# Patient Record
Sex: Male | Born: 1965 | Race: White | Hispanic: No | Marital: Married | Smoking: Never smoker
Health system: Southern US, Community
[De-identification: ages and names within clinical notes are randomized; demographics above are authoritative.]

## PROBLEM LIST (undated history)

## (undated) DIAGNOSIS — R112 Nausea with vomiting, unspecified: Secondary | ICD-10-CM

## (undated) DIAGNOSIS — Z9889 Other specified postprocedural states: Secondary | ICD-10-CM

## (undated) DIAGNOSIS — K219 Gastro-esophageal reflux disease without esophagitis: Secondary | ICD-10-CM

## (undated) HISTORY — PX: TENNIS ELBOW RELEASE/NIRSCHEL PROCEDURE: SHX6651

## (undated) HISTORY — PX: TYMPANOSTOMY TUBE PLACEMENT: SHX32

## (undated) HISTORY — PX: CLEFT LIP REPAIR: SUR1164

## (undated) HISTORY — PX: SHOULDER ARTHROSCOPY: SHX128

## (undated) HISTORY — PX: INNER EAR SURGERY: SHX679

---

## 2003-03-26 ENCOUNTER — Emergency Department (HOSPITAL_COMMUNITY): Admission: EM | Admit: 2003-03-26 | Discharge: 2003-03-26 | Payer: Self-pay | Admitting: Emergency Medicine

## 2003-03-26 ENCOUNTER — Encounter: Payer: Self-pay | Admitting: *Deleted

## 2005-12-26 ENCOUNTER — Emergency Department (HOSPITAL_COMMUNITY): Admission: EM | Admit: 2005-12-26 | Discharge: 2005-12-26 | Payer: Self-pay | Admitting: Family Medicine

## 2006-01-25 ENCOUNTER — Emergency Department (HOSPITAL_COMMUNITY): Admission: EM | Admit: 2006-01-25 | Discharge: 2006-01-25 | Payer: Self-pay | Admitting: Family Medicine

## 2008-06-19 ENCOUNTER — Encounter: Admission: RE | Admit: 2008-06-19 | Discharge: 2008-06-19 | Payer: Self-pay | Admitting: Orthopedic Surgery

## 2010-02-26 ENCOUNTER — Ambulatory Visit: Payer: Self-pay | Admitting: Family Medicine

## 2016-09-17 ENCOUNTER — Ambulatory Visit (INDEPENDENT_AMBULATORY_CARE_PROVIDER_SITE_OTHER): Payer: 59 | Admitting: Family Medicine

## 2016-09-17 ENCOUNTER — Encounter: Payer: Self-pay | Admitting: Family Medicine

## 2016-09-17 VITALS — BP 132/74 | HR 72 | Temp 98.2°F | Resp 12 | Ht 66.25 in | Wt 161.0 lb

## 2016-09-17 DIAGNOSIS — Z1211 Encounter for screening for malignant neoplasm of colon: Secondary | ICD-10-CM | POA: Diagnosis not present

## 2016-09-17 DIAGNOSIS — Z125 Encounter for screening for malignant neoplasm of prostate: Secondary | ICD-10-CM

## 2016-09-17 DIAGNOSIS — Z0000 Encounter for general adult medical examination without abnormal findings: Secondary | ICD-10-CM

## 2016-09-17 DIAGNOSIS — Z720 Tobacco use: Secondary | ICD-10-CM

## 2016-09-17 DIAGNOSIS — Z2821 Immunization not carried out because of patient refusal: Secondary | ICD-10-CM

## 2016-09-17 LAB — POCT URINALYSIS DIPSTICK
BILIRUBIN UA: NEGATIVE
Glucose, UA: NEGATIVE
KETONES UA: NEGATIVE
Leukocytes, UA: NEGATIVE
Nitrite, UA: NEGATIVE
Protein, UA: NEGATIVE
SPEC GRAV UA: 1.015
Urobilinogen, UA: 0.2
pH, UA: 6

## 2016-09-17 NOTE — Progress Notes (Signed)
Patient: James Ferguson, Male    DOB: April 17, 1966, 51 y.o.   MRN: [id number] Visit Date: 09/17/2016  Today's Provider: Wilhemena Durie, MD   Chief Complaint  Patient presents with  . Annual Exam   Subjective:  James Ferguson is a 51 y.o. male who presents today for health maintenance and complete physical. He feels well. He reports exercising work out free weights minimum 2 days a week to 4 days a week, also treadmill and stair climber 1 to 1 1/2 hours at a time. He reports he is sleeping well. 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 Immunization History  Administered Date(s) Administered  . Tdap 05/01/2010   Never had a colonoscopy. Does not get flu shots.  Review of Systems  Constitutional: Negative.   HENT: Negative.   Eyes: Negative.   Respiratory: Negative.   Cardiovascular: Negative.   Gastrointestinal: Negative.   Endocrine: Negative.   Genitourinary: Negative.   Musculoskeletal: Negative.   Skin: Positive for rash (gets eczema flare ups severe).  Allergic/Immunologic: Negative.   Neurological: Negative.   Hematological: Negative.   Psychiatric/Behavioral: Negative.     Social History   Social History  . Marital status: Married    Spouse name: N/A  . Number of children: N/A  . Years of education: N/A   Occupational History  . Not on file.   Social History Main Topics  . Smoking status: Never Smoker  . Smokeless tobacco: Current User     Comment: smokeless pouches-2 to 3 pouches daily, since around 1980s off and on  . Alcohol use Yes     Comment: occasionally beer-2  a month  . Drug use: No  . Sexual activity: Yes   Other Topics Concern  . Not on file   Social History Narrative  . No narrative on file    There are no active problems to display for this patient.   Past Surgical History:  Procedure Laterality Date  . CLEFT LIP REPAIR    . INNER EAR SURGERY     ear drum graft  . SHOULDER ARTHROSCOPY     bilateral  .  TENNIS ELBOW RELEASE/NIRSCHEL PROCEDURE    . TYMPANOSTOMY TUBE PLACEMENT     x 6    His family history includes ALS in his father; Fibromyalgia in his mother; Migraines in his mother.     Outpatient Encounter Prescriptions as of 09/17/2016  Medication Sig  . diphenhydrAMINE (BENADRYL) 25 MG tablet Take 25 mg by mouth daily.  . halobetasol (ULTRAVATE) 0.05 % ointment Apply topically 2 (two) times daily.  . meloxicam (MOBIC) 7.5 MG tablet Take 7.5 mg by mouth daily as needed for pain.   No facility-administered encounter medications on file as of 09/17/2016.     Patient Care Team: Jerrol Banana., MD as PCP - General (Family Medicine)      Objective:   Vitals:  Vitals:   09/17/16 1411  BP: 132/74  Pulse: 72  Resp: 12  Temp: 98.2 F (36.8 C)  Weight: 161 lb (73 kg)  Height: 5' 6.25" (1.683 m)    Physical Exam  Constitutional: He is oriented to person, place, and time. He appears well-developed and well-nourished.  HENT:  Head: Normocephalic and atraumatic.  Right Ear: External ear normal.  Left Ear: External ear normal.  Mouth/Throat: Oropharynx is clear and moist.  Status post cleft lip repair  Eyes: Conjunctivae are normal. Pupils are equal, round, and reactive to  light.  Neck: Normal range of motion. Neck supple.  Cardiovascular: Normal rate, regular rhythm, normal heart sounds and intact distal pulses.   No murmur heard. Pulmonary/Chest: Effort normal and breath sounds normal. No respiratory distress. He has no wheezes.  Abdominal: Soft. He exhibits no distension. There is no tenderness.  Genitourinary: Penis normal. No penile tenderness.  Musculoskeletal: He exhibits no edema or tenderness.  Neurological: He is alert and oriented to person, place, and time. No cranial nerve deficit. He exhibits normal muscle tone. Coordination normal.  Skin: Skin is warm and dry. No rash noted. No erythema.  Mild to moderate eczema that is well treated on both Shins.   Psychiatric: He has a normal mood and affect. His behavior is normal. Judgment and thought content normal.     Depression Screen PHQ 2/9 Scores 09/17/2016  PHQ - 2 Score 0   Assessment & Plan:   1. Annual physical exam - CBC w/Diff/Platelet - Comprehensive metabolic panel - Lipid Panel With LDL/HDL Ratio - TSH - POCT urinalysis dipstick Return to clinic 1 year. 2. Colon cancer screening - Ambulatory referral to Gastroenterology DRE deferred to time of colonoscopy 3. Prostate cancer screening - PSA  4. Smokeless tobacco use   5. Influenza vaccination declined by patient  HPI, Exam and A&P transcribed under direction and in the presence of Miguel Aschoff, MD. I have done the exam and reviewed the chart and it is accurate to the best of my knowledge. Development worker, community has been used and  any errors in dictation or transcription are unintentional. Miguel Aschoff M.D. [facility]

## 2016-09-18 DIAGNOSIS — Z0000 Encounter for general adult medical examination without abnormal findings: Secondary | ICD-10-CM | POA: Diagnosis not present

## 2016-09-18 DIAGNOSIS — Z125 Encounter for screening for malignant neoplasm of prostate: Secondary | ICD-10-CM | POA: Diagnosis not present

## 2016-09-19 LAB — LIPID PANEL WITH LDL/HDL RATIO
CHOLESTEROL TOTAL: 175 mg/dL (ref 100–199)
HDL: 44 mg/dL (ref >39)
LDL Calculated: 118 mg/dL — ABNORMAL HIGH (ref 0–99)
LDL/HDL RATIO: 2.7 ratio (ref 0.0–3.6)
TRIGLYCERIDES: 66 mg/dL (ref 0–149)
VLDL Cholesterol Cal: 13 mg/dL (ref 5–40)

## 2016-09-19 LAB — CBC WITH DIFFERENTIAL/PLATELET
BASOS: 1 %
Basophils Absolute: 0 10*3/uL (ref 0.0–0.2)
EOS (ABSOLUTE): 0.6 10*3/uL — AB (ref 0.0–0.4)
EOS: 16 %
HEMATOCRIT: 43.5 % (ref 37.5–51.0)
Hemoglobin: 14.9 g/dL (ref 13.0–17.7)
IMMATURE GRANS (ABS): 0 10*3/uL (ref 0.0–0.1)
IMMATURE GRANULOCYTES: 0 %
LYMPHS: 28 %
Lymphocytes Absolute: 1.1 10*3/uL (ref 0.7–3.1)
MCH: 31.8 pg (ref 26.6–33.0)
MCHC: 34.3 g/dL (ref 31.5–35.7)
MCV: 93 fL (ref 79–97)
MONOS ABS: 0.3 10*3/uL (ref 0.1–0.9)
Monocytes: 6 %
NEUTROS PCT: 49 %
Neutrophils Absolute: 1.9 10*3/uL (ref 1.4–7.0)
PLATELETS: 207 10*3/uL (ref 150–379)
RBC: 4.68 x10E6/uL (ref 4.14–5.80)
RDW: 14 % (ref 12.3–15.4)
WBC: 3.9 10*3/uL (ref 3.4–10.8)

## 2016-09-19 LAB — COMPREHENSIVE METABOLIC PANEL
A/G RATIO: 1.6 (ref 1.2–2.2)
ALK PHOS: 104 IU/L (ref 39–117)
ALT: 16 IU/L (ref 0–44)
AST: 20 IU/L (ref 0–40)
Albumin: 4.4 g/dL (ref 3.5–5.5)
BUN/Creatinine Ratio: 15 (ref 9–20)
BUN: 16 mg/dL (ref 6–24)
Bilirubin Total: 0.7 mg/dL (ref 0.0–1.2)
CALCIUM: 9.4 mg/dL (ref 8.7–10.2)
CHLORIDE: 100 mmol/L (ref 96–106)
CO2: 25 mmol/L (ref 18–29)
Creatinine, Ser: 1.06 mg/dL (ref 0.76–1.27)
GFR calc Af Amer: 94 mL/min/{1.73_m2} (ref >59)
GFR, EST NON AFRICAN AMERICAN: 81 mL/min/{1.73_m2} (ref >59)
GLOBULIN, TOTAL: 2.7 g/dL (ref 1.5–4.5)
Glucose: 87 mg/dL (ref 65–99)
POTASSIUM: 4.7 mmol/L (ref 3.5–5.2)
SODIUM: 141 mmol/L (ref 134–144)
Total Protein: 7.1 g/dL (ref 6.0–8.5)

## 2016-09-19 LAB — TSH: TSH: 2.21 u[IU]/mL (ref 0.450–4.500)

## 2016-09-19 LAB — PSA: PROSTATE SPECIFIC AG, SERUM: 0.5 ng/mL (ref 0.0–4.0)

## 2016-09-19 NOTE — Progress Notes (Signed)
Patient advised  ED 

## 2016-10-16 ENCOUNTER — Telehealth: Payer: Self-pay | Admitting: Gastroenterology

## 2016-10-16 NOTE — Telephone Encounter (Signed)
colonoscopy

## 2016-10-30 ENCOUNTER — Other Ambulatory Visit: Payer: Self-pay

## 2016-10-30 ENCOUNTER — Telehealth: Payer: Self-pay

## 2016-10-30 DIAGNOSIS — Z1211 Encounter for screening for malignant neoplasm of colon: Secondary | ICD-10-CM

## 2016-10-30 NOTE — Telephone Encounter (Signed)
Gastroenterology Pre-Procedure Review  Request Date: 11/13/16 Requesting Physician: Dr. Vicente Males  PATIENT REVIEW QUESTIONS: The patient responded to the following health history questions as indicated:    1. Are you having any GI issues? no 2. Do you have a personal history of Polyps? no 3. Do you have a family history of Colon Cancer or Polyps? no 4. Diabetes Mellitus? no 5. Joint replacements in the past 12 months?no 6. Major health problems in the past 3 months?no 7. Any artificial heart valves, MVP, or defibrillator?no    MEDICATIONS & ALLERGIES:    Patient reports the following regarding taking any anticoagulation/antiplatelet therapy:   Plavix, Coumadin, Eliquis, Xarelto, Lovenox, Pradaxa, Brilinta, or Effient? no Aspirin? no  Patient confirms/reports the following medications:  Current Outpatient Prescriptions  Medication Sig Dispense Refill  . diphenhydrAMINE (BENADRYL) 25 MG tablet Take 25 mg by mouth daily.    . halobetasol (ULTRAVATE) 0.05 % ointment Apply topically 2 (two) times daily.    . meloxicam (MOBIC) 7.5 MG tablet Take 7.5 mg by mouth daily as needed for pain.     No current facility-administered medications for this visit.     Patient confirms/reports the following allergies:  No Known Allergies  No orders of the defined types were placed in this encounter.   AUTHORIZATION INFORMATION Primary Insurance: 1D#: Group #:  Secondary Insurance: 1D#: Group #:  SCHEDULE INFORMATION: Date: 11/13/16   Time: Location: [place]

## 2016-11-13 ENCOUNTER — Ambulatory Visit: Payer: Commercial Managed Care - HMO | Admitting: Anesthesiology

## 2016-11-13 ENCOUNTER — Encounter: Payer: Self-pay | Admitting: Anesthesiology

## 2016-11-13 ENCOUNTER — Encounter
Admission: RE | Disposition: A | Discharge status: Home / Self Care | Payer: Self-pay | Source: Ambulatory Visit | Attending: Gastroenterology

## 2016-11-13 ENCOUNTER — Ambulatory Visit
Admission: RE | Admit: 2016-11-13 | Discharge: 2016-11-13 | Disposition: A | Discharge status: Home / Self Care | Payer: Commercial Managed Care - HMO | Source: Ambulatory Visit | Attending: Gastroenterology | Admitting: Gastroenterology

## 2016-11-13 DIAGNOSIS — Z1211 Encounter for screening for malignant neoplasm of colon: Secondary | ICD-10-CM | POA: Diagnosis not present

## 2016-11-13 DIAGNOSIS — K621 Rectal polyp: Secondary | ICD-10-CM | POA: Diagnosis not present

## 2016-11-13 DIAGNOSIS — K635 Polyp of colon: Secondary | ICD-10-CM | POA: Diagnosis not present

## 2016-11-13 DIAGNOSIS — Z79899 Other long term (current) drug therapy: Secondary | ICD-10-CM | POA: Insufficient documentation

## 2016-11-13 DIAGNOSIS — D128 Benign neoplasm of rectum: Secondary | ICD-10-CM | POA: Diagnosis not present

## 2016-11-13 HISTORY — PX: COLONOSCOPY WITH PROPOFOL: SHX5780

## 2016-11-13 SURGERY — COLONOSCOPY WITH PROPOFOL
Anesthesia: General

## 2016-11-13 MED ORDER — ONDANSETRON HCL 4 MG/2ML IJ SOLN
INTRAMUSCULAR | Status: DC | PRN
  Administered 2016-11-13: 4 mg via INTRAVENOUS

## 2016-11-13 MED ORDER — ONDANSETRON HCL 4 MG/2ML IJ SOLN
INTRAMUSCULAR | Status: AC
  Filled 2016-11-13: qty 2

## 2016-11-13 MED ORDER — PROPOFOL 500 MG/50ML IV EMUL
INTRAVENOUS | Status: DC | PRN
  Administered 2016-11-13: 100 ug/kg/min via INTRAVENOUS

## 2016-11-13 MED ORDER — PROPOFOL 500 MG/50ML IV EMUL
INTRAVENOUS | Status: AC
  Filled 2016-11-13: qty 50

## 2016-11-13 MED ORDER — SODIUM CHLORIDE 0.9 % IV SOLN
INTRAVENOUS | Status: DC
  Administered 2016-11-13 (×2): via INTRAVENOUS

## 2016-11-13 NOTE — Transfer of Care (Signed)
Immediate Anesthesia Transfer of Care Note  Patient: James Ferguson  Procedure(s) Performed: Procedure(s): COLONOSCOPY WITH PROPOFOL (N/A)  Patient Location: PACU  Anesthesia Type:General  Level of Consciousness: awake, alert  and oriented  Airway & Oxygen Therapy: Patient Spontanous Breathing and Patient connected to nasal cannula oxygen  Post-op Assessment: Report given to RN and Post -op Vital signs reviewed and stable  Post vital signs: Reviewed and stable  Last Vitals:  Vitals:   11/13/16 0912  BP: 123/85  Pulse: 75  Resp: 14  Temp: 36.3 C    Last Pain:  Vitals:   11/13/16 0912  TempSrc: Tympanic         Complications: No apparent anesthesia complications

## 2016-11-13 NOTE — Anesthesia Post-op Follow-up Note (Cosign Needed)
Anesthesia QCDR form completed.        

## 2016-11-13 NOTE — Anesthesia Postprocedure Evaluation (Signed)
Anesthesia Post Note  Patient: James Ferguson  Procedure(s) Performed: Procedure(s) (LRB): COLONOSCOPY WITH PROPOFOL (N/A)  Patient location during evaluation: Endoscopy Anesthesia Type: General Level of consciousness: awake and alert Pain management: pain level controlled Vital Signs Assessment: post-procedure vital signs reviewed and stable Respiratory status: spontaneous breathing, nonlabored ventilation, respiratory function stable and patient connected to nasal cannula oxygen Cardiovascular status: blood pressure returned to baseline and stable Postop Assessment: no signs of nausea or vomiting Anesthetic complications: no     Last Vitals:  Vitals:   11/13/16 1051 11/13/16 1101  BP: 95/73 106/71  Pulse: (!) 56 (!) 58  Resp: 20 15  Temp:      Last Pain:  Vitals:   11/13/16 1031  TempSrc: Tympanic                 Ludivina Guymon S

## 2016-11-13 NOTE — Anesthesia Preprocedure Evaluation (Signed)
Anesthesia Evaluation  Patient identified by MRN, date of birth, ID band Patient awake    Reviewed: Allergy & Precautions, NPO status , Patient's Chart, lab work & pertinent test results, reviewed documented beta blocker date and time   Airway Mallampati: II  TM Distance: >3 FB     Dental  (+) Chipped   Pulmonary           Cardiovascular      Neuro/Psych    GI/Hepatic   Endo/Other    Renal/GU      Musculoskeletal   Abdominal   Peds  Hematology   Anesthesia Other Findings Cleft lip repair.  Reproductive/Obstetrics                             Anesthesia Physical Anesthesia Plan  ASA: III  Anesthesia Plan: General   Post-op Pain Management:    Induction: Intravenous  Airway Management Planned:   Additional Equipment:   Intra-op Plan:   Post-operative Plan:   Informed Consent: I have reviewed the patients History and Physical, chart, labs and discussed the procedure including the risks, benefits and alternatives for the proposed anesthesia with the patient or authorized representative who has indicated his/her understanding and acceptance.     Plan Discussed with: CRNA  Anesthesia Plan Comments:         Anesthesia Quick Evaluation

## 2016-11-13 NOTE — Op Note (Signed)
[facility] Gastroenterology Patient Name: James Ferguson Procedure Date: 11/13/2016 9:58 AM MRN: [id number] Account #: [id number] Date of Birth: 1966/08/20 Admit Type: Outpatient Age: 51 Room: [facility] ENDO ROOM 4 Gender: Male Note Status: Finalized Procedure:            Colonoscopy Indications:          Screening for colorectal malignant neoplasm Providers:            Jonathon Bellows MD, MD Referring MD:         Janine Ores. Rosanna Randy, MD (Referring MD) Medicines:            Monitored Anesthesia Care Complications:        No immediate complications. Procedure:            Pre-Anesthesia Assessment:                       - Prior to the procedure, a History and Physical was                        performed, and patient medications, allergies and                        sensitivities were reviewed. The patient's tolerance of                        previous anesthesia was reviewed.                       - The risks and benefits of the procedure and the                        sedation options and risks were discussed with the                        patient. All questions were answered and informed                        consent was obtained.                       - ASA Grade Assessment: II - A patient with mild                        systemic disease.                       After obtaining informed consent, the colonoscope was                        passed under direct vision. Throughout the procedure,                        the patient's blood pressure, pulse, and oxygen                        saturations were monitored continuously. The                        Colonoscope was introduced through the anus and  advanced to the the terminal ileum. The colonoscopy was                        performed with ease. The patient tolerated the                        procedure well. The quality of the bowel preparation                        was excellent. Findings:      The  perianal and digital rectal examinations were normal.      A 5 mm polyp was found in the rectum. The polyp was sessile. The polyp       was removed with a cold biopsy forceps. Resection and retrieval were       complete.      The exam was otherwise without abnormality on direct and retroflexion       views. Impression:           - One 5 mm polyp in the rectum, removed with a cold                        biopsy forceps. Resected and retrieved.                       - The examination was otherwise normal on direct and                        retroflexion views. Recommendation:       - Discharge patient to home (with escort).                       - Resume previous diet.                       - Continue present medications.                       - Await pathology results.                       - Repeat colonoscopy in 5-10 years for surveillance                        based on pathology results. Procedure Code(s):    --- Professional ---                       [id number], Colonoscopy, flexible; with biopsy, single or                        multiple Diagnosis Code(s):    --- Professional ---                       Z12.11, Encounter for screening for malignant neoplasm                        of colon                       K62.1, Rectal polyp CPT copyright 2016 American Medical Association. All rights reserved. The codes documented in this report are preliminary and upon coder  review may  be revised to meet current compliance requirements. Jonathon Bellows, MD Jonathon Bellows MD, MD 11/13/2016 10:25:03 AM This report has been signed electronically. Number of Addenda: 0 Note Initiated On: 11/13/2016 9:58 AM Scope Withdrawal Time: 0 hours 13 minutes 38 seconds  Total Procedure Duration: 0 hours 15 minutes 41 seconds       [facility]

## 2016-11-13 NOTE — H&P (Signed)
  Jonathon Bellows MD [address], [place], [place] [address] Phone: [phone] Fax : [phone]  Primary Care Physician:  Wilhemena Durie, MD Primary Gastroenterologist:  Dr. Jonathon Bellows   Pre-Procedure History & Physical: HPI:  James Ferguson is a 51 y.o. male is here for an colonoscopy.   History reviewed. No pertinent past medical history.  Past Surgical History:  Procedure Laterality Date  . CLEFT LIP REPAIR    . INNER EAR SURGERY     ear drum graft  . SHOULDER ARTHROSCOPY     bilateral  . TENNIS ELBOW RELEASE/NIRSCHEL PROCEDURE    . TYMPANOSTOMY TUBE PLACEMENT     x 6    Prior to Admission medications   Medication Sig Start Date End Date Taking? Authorizing Provider  diphenhydrAMINE (BENADRYL) 25 MG tablet Take 25 mg by mouth daily.   Yes Historical Provider, MD  halobetasol (ULTRAVATE) 0.05 % ointment Apply topically 2 (two) times daily.   Yes Historical Provider, MD  meloxicam (MOBIC) 7.5 MG tablet Take 7.5 mg by mouth daily as needed for pain.    Historical Provider, MD    Allergies as of 10/30/2016  . (No Known Allergies)    Family History  Problem Relation Age of Onset  . Migraines Mother   . Fibromyalgia Mother   . ALS Father     Social History   Social History  . Marital status: Married    Spouse name: N/A  . Number of children: N/A  . Years of education: N/A   Occupational History  . Not on file.   Social History Main Topics  . Smoking status: Never Smoker  . Smokeless tobacco: Current User    Types: Snuff     Comment: smokeless pouches-2 to 3 pouches daily, since around 1980s off and on  . Alcohol use Yes     Comment: occasionally beer-2  a month  . Drug use: No  . Sexual activity: Yes   Other Topics Concern  . Not on file   Social History Narrative  . No narrative on file    Review of Systems: See HPI, otherwise negative ROS  Physical Exam: BP 123/85   Pulse 75   Temp 97.3 F (36.3 C) (Tympanic)   Resp 14   Ht 5'  6" (1.676 m)   Wt 150 lb (68 kg)   SpO2 99%   BMI 24.21 kg/m  General:   Alert,  pleasant and cooperative in NAD Head:  Normocephalic and atraumatic. Neck:  Supple; no masses or thyromegaly. Lungs:  Clear throughout to auscultation.    Heart:  Regular rate and rhythm. Abdomen:  Soft, nontender and nondistended. Normal bowel sounds, without guarding, and without rebound.   Neurologic:  Alert and  oriented x4;  grossly normal neurologically.  Impression/Plan: ARLESS VINEYARD is here for an colonoscopy to be performed for Screening colonoscopy average risk   Risks, benefits, limitations, and alternatives regarding  colonoscopy have been reviewed with the patient.  Questions have been answered.  All parties agreeable.   Jonathon Bellows, MD  11/13/2016, 9:50 AM

## 2016-11-14 ENCOUNTER — Encounter: Payer: Self-pay | Admitting: Gastroenterology

## 2016-11-14 LAB — SURGICAL PATHOLOGY

## 2018-04-29 DIAGNOSIS — L820 Inflamed seborrheic keratosis: Secondary | ICD-10-CM | POA: Diagnosis not present

## 2018-08-24 ENCOUNTER — Telehealth: Payer: Self-pay | Admitting: Family Medicine

## 2018-08-24 NOTE — Telephone Encounter (Signed)
Pt has been having hiccups since 4 pm yesterday. Not having any other symptoms. Pt wanting to know what he needs to do to get rid of them.  Please advise.  Thanks, [employer]

## 2018-08-24 NOTE — Telephone Encounter (Signed)
Please review. Thanks!  

## 2018-08-25 ENCOUNTER — Ambulatory Visit: Payer: Self-pay | Admitting: Physician Assistant

## 2018-08-25 ENCOUNTER — Other Ambulatory Visit: Payer: Self-pay | Admitting: Family Medicine

## 2018-08-25 MED ORDER — BACLOFEN 10 MG PO TABS: 10.0000 mg | ORAL_TABLET | Freq: Three times a day (TID) | ORAL | 1 refills | Status: DC | PRN

## 2018-08-25 MED ORDER — BACLOFEN 10 MG PO TABS: 10.0000 mg | ORAL_TABLET | Freq: Three times a day (TID) | ORAL | 0 refills | Status: DC

## 2018-08-25 NOTE — Addendum Note (Signed)
Addended by: Eulas Post on: 08/25/2018 02:54 PM   Modules accepted: Orders

## 2018-08-25 NOTE — Progress Notes (Signed)
For more than 48 hours of hiccups.

## 2018-08-26 ENCOUNTER — Telehealth: Payer: Self-pay | Admitting: Family Medicine

## 2018-08-26 ENCOUNTER — Other Ambulatory Visit: Payer: Self-pay | Admitting: Family Medicine

## 2018-08-26 MED ORDER — CHLORPROMAZINE HCL 10 MG PO TABS: 10.0000 mg | ORAL_TABLET | Freq: Three times a day (TID) | ORAL | 1 refills | Status: DC | PRN

## 2018-08-26 NOTE — Telephone Encounter (Signed)
Pt is needing to know what else he can do for the hiccups.  The medicine Dr. Darnell Level. Prescribed helps is he is still, but they come back if he moves around.  Please advise.  Thanks, [employer]

## 2018-08-26 NOTE — Telephone Encounter (Signed)
I took care of this a few minutes ago with chlorpromazine Rx sent in.

## 2018-08-26 NOTE — Telephone Encounter (Signed)
Please advise 

## 2018-08-26 NOTE — Progress Notes (Signed)
Hiccups no better with Baclofen. Try chrorpromazine.

## 2018-08-28 ENCOUNTER — Emergency Department
Admission: EM | Admit: 2018-08-28 | Discharge: 2018-08-28 | Disposition: A | Discharge status: Home / Self Care | Payer: Commercial Managed Care - HMO | Attending: Emergency Medicine | Admitting: Emergency Medicine

## 2018-08-28 ENCOUNTER — Encounter: Payer: Self-pay | Admitting: Medical Oncology

## 2018-08-28 ENCOUNTER — Emergency Department: Payer: Commercial Managed Care - HMO

## 2018-08-28 DIAGNOSIS — F17228 Nicotine dependence, chewing tobacco, with other nicotine-induced disorders: Secondary | ICD-10-CM | POA: Insufficient documentation

## 2018-08-28 DIAGNOSIS — R066 Hiccough: Secondary | ICD-10-CM | POA: Insufficient documentation

## 2018-08-28 DIAGNOSIS — K8020 Calculus of gallbladder without cholecystitis without obstruction: Secondary | ICD-10-CM | POA: Diagnosis not present

## 2018-08-28 DIAGNOSIS — K7689 Other specified diseases of liver: Secondary | ICD-10-CM | POA: Diagnosis not present

## 2018-08-28 LAB — COMPREHENSIVE METABOLIC PANEL
ALK PHOS: 89 U/L (ref 38–126)
ALT: 18 U/L (ref 0–44)
AST: 25 U/L (ref 15–41)
Albumin: 4.3 g/dL (ref 3.5–5.0)
Anion gap: 8 (ref 5–15)
BILIRUBIN TOTAL: 1.3 mg/dL — AB (ref 0.3–1.2)
BUN: 16 mg/dL (ref 6–20)
CHLORIDE: 104 mmol/L (ref 98–111)
CO2: 25 mmol/L (ref 22–32)
CREATININE: 1.21 mg/dL (ref 0.61–1.24)
Calcium: 9 mg/dL (ref 8.9–10.3)
GLUCOSE: 95 mg/dL (ref 70–99)
POTASSIUM: 4 mmol/L (ref 3.5–5.1)
SODIUM: 137 mmol/L (ref 135–145)
Total Protein: 7.2 g/dL (ref 6.5–8.1)

## 2018-08-28 LAB — CBC
HCT: 41.6 % (ref 39.0–52.0)
HEMOGLOBIN: 14.9 g/dL (ref 13.0–17.0)
MCH: 31.6 pg (ref 26.0–34.0)
MCHC: 35.8 g/dL (ref 30.0–36.0)
MCV: 88.1 fL (ref 80.0–100.0)
Platelets: 191 10*3/uL (ref 150–400)
RBC: 4.72 MIL/uL (ref 4.22–5.81)
RDW: 12.5 % (ref 11.5–15.5)
WBC: 4.6 10*3/uL (ref 4.0–10.5)
nRBC: 0 % (ref 0.0–0.2)

## 2018-08-28 LAB — LIPASE, BLOOD: Lipase: 24 U/L (ref 11–51)

## 2018-08-28 MED ORDER — IOHEXOL 300 MG/ML  SOLN
75.0000 mL | Freq: Once | INTRAMUSCULAR | Status: AC | PRN
  Administered 2018-08-28: 75 mL via INTRAVENOUS

## 2018-08-28 MED ORDER — METOCLOPRAMIDE HCL 5 MG/ML IJ SOLN
10.0000 mg | Freq: Once | INTRAMUSCULAR | Status: AC
  Administered 2018-08-28: 10 mg via INTRAVENOUS
  Filled 2018-08-28: qty 2

## 2018-08-28 MED ORDER — SODIUM CHLORIDE 0.9 % IV BOLUS
1000.0000 mL | Freq: Once | INTRAVENOUS | Status: AC
  Administered 2018-08-28: 1000 mL via INTRAVENOUS

## 2018-08-28 MED ORDER — HALOPERIDOL LACTATE 5 MG/ML IJ SOLN
2.0000 mg | Freq: Once | INTRAMUSCULAR | Status: AC
  Administered 2018-08-28: 2 mg via INTRAVENOUS
  Filled 2018-08-28: qty 1

## 2018-08-28 NOTE — ED Provider Notes (Signed)
[facility] Emergency Department Provider Note  Time seen: 1:13 PM  I have reviewed the triage vital signs and the nursing notes.   HISTORY  Chief Complaint Hiccups    HPI James Ferguson is a 52 y.o. male with no significant past medical history presents to the emergency department with intractable hiccups.  According to the patient for the past 5 days he has been experiencing nonstop pickups.  Patient called his primary care doctor and was prescribed baclofen, states no relief with baclofen, was called in chlorpromazine, states he did have some effect with this medication although when he awoke this morning the hiccups started once again at 6 AM and have not stopped despite taking the medication again.  Patient states this happened to him in 1998 lasting 3 days before finally stopping.  Patient denies any chest pain.  Denies any trouble breathing denies abdominal pain.  Has no symptoms with a negative review of systems besides the hiccups.   History reviewed. No pertinent past medical history.  Patient Active Problem List   Diagnosis Date Noted  . Special screening for malignant neoplasms, colon   . Rectal polyp   . Smokeless tobacco use 09/17/2016    Past Surgical History:  Procedure Laterality Date  . CLEFT LIP REPAIR    . COLONOSCOPY WITH PROPOFOL N/A 11/13/2016   Procedure: COLONOSCOPY WITH PROPOFOL;  Surgeon: Jonathon Bellows, MD;  Location: [facility] ENDOSCOPY;  Service: Endoscopy;  Laterality: N/A;  . INNER EAR SURGERY     ear drum graft  . SHOULDER ARTHROSCOPY     bilateral  . TENNIS ELBOW RELEASE/NIRSCHEL PROCEDURE    . TYMPANOSTOMY TUBE PLACEMENT     x 6    Prior to Admission medications   Medication Sig Start Date End Date Taking? Authorizing Provider  baclofen (LIORESAL) 10 MG tablet Take 1 tablet (10 mg total) by mouth 3 (three) times daily as needed for muscle spasms. 08/25/18   Jerrol Banana., MD  chlorproMAZINE (THORAZINE) 10 MG tablet Take  1 tablet (10 mg total) by mouth 3 (three) times daily as needed. 08/26/18   Jerrol Banana., MD  diphenhydrAMINE (BENADRYL) 25 MG tablet Take 25 mg by mouth daily.    [provider]  halobetasol (ULTRAVATE) 0.05 % ointment Apply topically 2 (two) times daily.    [provider]  meloxicam (MOBIC) 7.5 MG tablet Take 7.5 mg by mouth daily as needed for pain.    [provider]    No Known Allergies  Family History  Problem Relation Age of Onset  . Migraines Mother   . Fibromyalgia Mother   . ALS Father     Social History Social History   Tobacco Use  . Smoking status: Never Smoker  . Smokeless tobacco: Current User    Types: Snuff  . Tobacco comment: smokeless pouches-2 to 3 pouches daily, since around 1980s off and on  Substance Use Topics  . Alcohol use: Yes    Comment: occasionally beer-2  a month  . Drug use: No    Review of Systems Constitutional: Negative for fever. Cardiovascular: Negative for chest pain. Respiratory: Negative for shortness of breath. Gastrointestinal: Negative for abdominal pain, vomiting  Genitourinary: Negative for urinary compaints Musculoskeletal: Negative for musculoskeletal complaints Skin: Negative for skin complaints  Neurological: Negative for headache All other ROS negative  ____________________________________________   PHYSICAL EXAM:  VITAL SIGNS: ED Triage Vitals  Enc Vitals Group     BP 08/28/18 1252  135/86     Pulse Rate 08/28/18 1252 70     Resp 08/28/18 1252 16     Temp 08/28/18 1252 97.7 F (36.5 C)     Temp Source 08/28/18 1252 Oral     SpO2 08/28/18 1252 97 %     Weight 08/28/18 1249 149 lb 14.6 oz (68 kg)     Height --      Head Circumference --      Peak Flow --      Pain Score 08/28/18 1249 0     Pain Loc --      Pain Edu? --      Excl. in [place]? --     Constitutional: Alert and oriented. Well appearing and in no distress. Eyes: Normal exam ENT   Head: Normocephalic and  atraumatic   Mouth/Throat: Mucous membranes are moist. Cardiovascular: Normal rate, regular rhythm. Respiratory: Normal respiratory effort without tachypnea nor retractions. Breath sounds are clear Gastrointestinal: Soft and nontender. No distention.  Musculoskeletal: Nontender with normal range of motion in all extremities.  Neurologic:  Normal speech and language. No gross focal neurologic deficits  Skin:  Skin is warm, dry and intact.  Psychiatric: Mood and affect are normal.   ____________________________________________    RADIOLOGY  CT scan shows a 1.2 cm gallstone.  1.7 cm liver cyst.  ____________________________________________   INITIAL IMPRESSION / ASSESSMENT AND PLAN / ED COURSE  Pertinent labs & imaging results that were available during my care of the patient were reviewed by me and considered in my medical decision making (see chart for details).  Patient presents to the emergency department for intractable hiccups x5 days.  Has tried 2 medications at home with minimal relief.  Given 5 days of intractable hiccups I discussed with the patient CT imaging, patient agreeable to CT scan of the chest to rule out undiagnosed pathology that could be causing diaphragmatic spasm.  We will also dose a small dose of IV Haldol, IV Reglan and IV fluids.  Patient agreeable to plan of care.  CT scan does show 1.2 cm gallstone and appears to be down in the neck of the gallbladder and my evaluation.  Given the patient's intractable hiccups we will proceed with a right upper quadrant ultrasound to rule out gallbladder pathology.  Patient is nontender in the right upper quadrant currently.  Patient's hiccups stopped around 1:45 PM and have been stopped for over 1 hour at this time.  I discussed with the patient as long as the ultrasound does not show any concerning findings it would be safe to go home but to continue his chlorpromazine 3 times daily for the next 5 days and follow-up with his  doctor.  Patient agreeable to plan of care.  Ultrasound pending, patient care signed out to oncoming physician. ____________________________________________   FINAL CLINICAL IMPRESSION(S) / ED DIAGNOSES  Intractable hiccups   Harvest Dark, MD 08/28/18 1446

## 2018-08-28 NOTE — ED Provider Notes (Signed)
-----------------------------------------   4:56 PM on 08/28/2018 -----------------------------------------  I took over care on this patient from Dr. Kerman Passey.  The ultrasound shows a gallstone with no evidence of acute cholecystitis.  The patient's lipase is negative.  He continues to remain comfortable with minimal hiccups at this time.  He is stable for discharge home.  I counseled him on the results of the work-up and return precautions, and he expressed understanding.   Arta Silence, MD 08/28/18 [phone]

## 2018-08-28 NOTE — ED Triage Notes (Signed)
Pt reports 5 days of the hiccups, has been prescribed 2 different meds by Dr Rosanna Randy without relief.

## 2018-08-28 NOTE — Discharge Instructions (Signed)
Please continue to take your chlorpromazine 3 times daily for the next 5 days.  Please follow-up with your primary care doctor for recheck/reevaluation.  Please call the number to follow-up with general surgery.  For further evaluation and to discuss possibly having your gallbladder removed if deemed necessary.

## 2018-08-30 ENCOUNTER — Telehealth: Payer: Self-pay | Admitting: Family Medicine

## 2018-08-30 DIAGNOSIS — K8010 Calculus of gallbladder with chronic cholecystitis without obstruction: Secondary | ICD-10-CM

## 2018-08-30 NOTE — Telephone Encounter (Signed)
Order has been placed. KW

## 2018-08-30 NOTE — Telephone Encounter (Signed)
Ok but probably not necessary.

## 2018-08-30 NOTE — Telephone Encounter (Signed)
Patient is still having hiccups/spasms and had to go to ER on Saturday.   CT scan showed that there is a stone in the neck of his gallbladder.  However, the ER doc said there was no issues with this unless it started causing him pain.   The patient would like to be reffered back to Dr. Vicente Males, GI for this issue.

## 2018-08-30 NOTE — Telephone Encounter (Signed)
Ok to refer.

## 2018-09-15 ENCOUNTER — Encounter: Payer: Self-pay | Admitting: General Surgery

## 2018-09-15 ENCOUNTER — Other Ambulatory Visit: Payer: Self-pay

## 2018-09-15 ENCOUNTER — Ambulatory Visit (INDEPENDENT_AMBULATORY_CARE_PROVIDER_SITE_OTHER): Payer: 59 | Admitting: General Surgery

## 2018-09-15 VITALS — BP 146/87 | HR 87 | Temp 97.9°F | Resp 12 | Ht 66.0 in | Wt 156.0 lb

## 2018-09-15 DIAGNOSIS — K8020 Calculus of gallbladder without cholecystitis without obstruction: Secondary | ICD-10-CM | POA: Diagnosis not present

## 2018-09-15 NOTE — Progress Notes (Signed)
Patient ID: James Ferguson, male   DOB: October 09, 1965, 53 y.o.   MRN: [id number]  Chief Complaint  Patient presents with  . New Patient (Initial Visit)    new pt seen in ER gallstones     HPI James Ferguson is a 53 y.o. male. He is here as a referral from the emergency department.  He states that he began having hiccups on 16 December.  His primary care provider prescribed baclofen which helped somewhat but did not cause them to completely remit.  He then went to the emergency department on 21 December, after he continued to have persistent, intractable hiccups.  He was also placed on chlorpromazine, which did help as well.  In the emergency department, a CT scan was performed to evaluate for possible etiologies.  The scan identified a stone in his gallbladder, but did not delineate any potential diaphragmatic irritants causing his hiccups.  He states that the hiccups have since resolved.  He has never had right upper quadrant pain.  His wife comments that for the past 3 years, he is "miserable" after he eats.  The patient feels like this is primarily due to the way that he eats.  He states that he eats quickly and eats until he feels stuffed.  He states that the epigastric discomfort does not seem to be related to what he eats, rather the volume of what he eats.  He has never had jaundice or pancreatitis.   History reviewed. No pertinent past medical history.  Past Surgical History:  Procedure Laterality Date  . CLEFT LIP REPAIR    . COLONOSCOPY WITH PROPOFOL N/A 11/13/2016   Procedure: COLONOSCOPY WITH PROPOFOL;  Surgeon: Jonathon Bellows, MD;  Location: [facility] ENDOSCOPY;  Service: Endoscopy;  Laterality: N/A;  . INNER EAR SURGERY     ear drum graft  . SHOULDER ARTHROSCOPY     bilateral  . TENNIS ELBOW RELEASE/NIRSCHEL PROCEDURE    . TYMPANOSTOMY TUBE PLACEMENT     x 6    Family History  Problem Relation Age of Onset  . Migraines Mother   . Fibromyalgia Mother   . ALS Father     Social  History Social History   Tobacco Use  . Smoking status: Never Smoker  . Smokeless tobacco: Current User    Types: Snuff  . Tobacco comment: smokeless pouches-2 to 3 pouches daily, since around 1980s off and on  Substance Use Topics  . Alcohol use: Yes    Comment: occasionally beer-2  a month  . Drug use: No    No Known Allergies  Current Outpatient Medications  Medication Sig Dispense Refill  . baclofen (LIORESAL) 10 MG tablet Take 1 tablet (10 mg total) by mouth 3 (three) times daily as needed for muscle spasms. 30 each 1  . chlorproMAZINE (THORAZINE) 10 MG tablet Take 1 tablet (10 mg total) by mouth 3 (three) times daily as needed. 30 tablet 1  . diphenhydrAMINE (BENADRYL) 25 MG tablet Take 25 mg by mouth daily.    Marland Kitchen esomeprazole (NEXIUM 24HR) 20 MG capsule Take 20 mg by mouth daily.      No current facility-administered medications for this visit.     Review of Systems Review of Systems  All other systems reviewed and are negative.   Blood pressure (!) 146/87, pulse 87, temperature 97.9 F (36.6 C), temperature source Temporal, resp. rate 12, height 5\' 6"  (1.676 m), weight 156 lb (70.8 kg), SpO2 96 %.  Physical Exam Physical Exam Constitutional:  General: He is not in acute distress.    Appearance: Normal appearance. He is normal weight.  HENT:     Head: Normocephalic and atraumatic.     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    Mouth/Throat:     Mouth: Mucous membranes are moist.     Pharynx: No oropharyngeal exudate.  Eyes:     Conjunctiva/sclera: Conjunctivae normal.     Pupils: Pupils are equal, round, and reactive to light.  Neck:     Musculoskeletal: Normal range of motion and neck supple.  Cardiovascular:     Rate and Rhythm: Normal rate and regular rhythm.     Pulses: Normal pulses.  Pulmonary:     Effort: Pulmonary effort is normal.     Breath sounds: Normal breath sounds.  Abdominal:     General: Abdomen is flat.  There is no distension.     Palpations: Abdomen is soft. There is no mass.     Tenderness: There is no abdominal tenderness. There is no guarding or rebound.     Hernia: No hernia is present.  Musculoskeletal: Normal range of motion.  Lymphadenopathy:     Cervical: No cervical adenopathy.  Skin:    Comments: Scarring from old excoriation on bilateral shins (eczema)  Neurological:     General: No focal deficit present.     Mental Status: He is alert and oriented to person, place, and time.  Psychiatric:        Mood and Affect: Mood normal.        Behavior: Behavior normal.     Data Reviewed ER labs and imaging in the EMR  Assessment    This is a 53 year old man who presented to the emergency department for intractable hiccups.  These have since resolved.  On imaging, he was incidentally found to have a gallstone.  I do not believe that this was the cause of his hiccups.    Plan    Today James Ferguson and I discussed gallstones.  We discussed that many people have them without ever becoming symptomatic.  We also discussed the typical symptoms of biliary colic, including postprandial pain in the right upper quadrant that sometimes radiates toward the back or shoulder, exacerbation with spicy or fatty foods.  He has none of these.  I offered him the option of having his gallbladder removed, but did not think it was likely to be of any significant benefit to him at this time.  He agreed and wishes to simply observe for now.  He will contact me if he ever develops any symptoms more consistent with symptomatic cholelithiasis.  No scheduled return appointment has been made.       Fredirick Maudlin 09/15/2018, 5:00 PM

## 2018-09-15 NOTE — Patient Instructions (Addendum)
Patient is to return to the office as needed.   Call the office with any questions or concerns.   Cholelithiasis  Cholelithiasis is also called "gallstones." It is a kind of gallbladder disease. The gallbladder is an organ that stores a liquid (bile) that helps you digest fat. Gallstones may not cause symptoms (may be silent gallstones) until they cause a blockage, and then they can cause pain (gallbladder attack). Follow these instructions at home:  Take over-the-counter and prescription medicines only as told by your doctor.  Stay at a healthy weight.  Eat healthy foods. This includes: ? Eating fewer fatty foods, like fried foods. ? Eating fewer refined carbs (refined carbohydrates). Refined carbs are breads and grains that are highly processed, like white bread and white rice. Instead, choose whole grains like whole-wheat bread and brown rice. ? Eating more fiber. Almonds, fresh fruit, and beans are healthy sources of fiber.  Keep all follow-up visits as told by your doctor. This is important. Contact a doctor if:  You have sudden pain in the upper right side of your belly (abdomen). Pain might spread to your right shoulder or your chest. This may be a sign of a gallbladder attack.  You feel sick to your stomach (are nauseous).  You throw up (vomit).  You have been diagnosed with gallstones that have no symptoms and you get: ? Belly pain. ? Discomfort, burning, or fullness in the upper part of your belly (indigestion). Get help right away if:  You have sudden pain in the upper right side of your belly, and it lasts for more than 2 hours.  You have belly pain that lasts for more than 5 hours.  You have a fever or chills.  You keep feeling sick to your stomach or you keep throwing up.  Your skin or the whites of your eyes turn yellow (jaundice).  You have dark-colored pee (urine).  You have light-colored poop (stool). Summary  Cholelithiasis is also called  "gallstones."  The gallbladder is an organ that stores a liquid (bile) that helps you digest fat.  Silent gallstones are gallstones that do not cause symptoms.  A gallbladder attack may cause sudden pain in the upper right side of your belly. Pain might spread to your right shoulder or your chest. If this happens, contact your doctor.  If you have sudden pain in the upper right side of your belly that lasts for more than 2 hours, get help right away. This information is not intended to replace advice given to you by your health care provider. Make sure you discuss any questions you have with your health care provider. Document Released: 02/11/2008 Document Revised: 05/11/2016 Document Reviewed: 05/11/2016 Elsevier Interactive Patient Education  2019 [employer].

## 2018-10-13 ENCOUNTER — Ambulatory Visit: Payer: 59 | Admitting: Gastroenterology

## 2020-02-06 IMAGING — CT CT CHEST W/ CM
2 of 3 series · 15 of 36 positions shown, 18 images · IV contrast (omnipaque)
Comparison: None.

CLINICAL DATA: Five days of hiccups.

EXAM:
CT CHEST WITH CONTRAST
TECHNIQUE: Multidetector CT imaging of the chest was performed during
intravenous contrast administration.
CONTRAST:  75mL OMNIPAQUE IOHEXOL 300 MG/ML  SOLN

[Series 2: axial st · axial · 0.69mm/px · z∈[-351,-109]mm · 12 of 143 slices shown, 15 images]
[im 11/143  mediastinal]
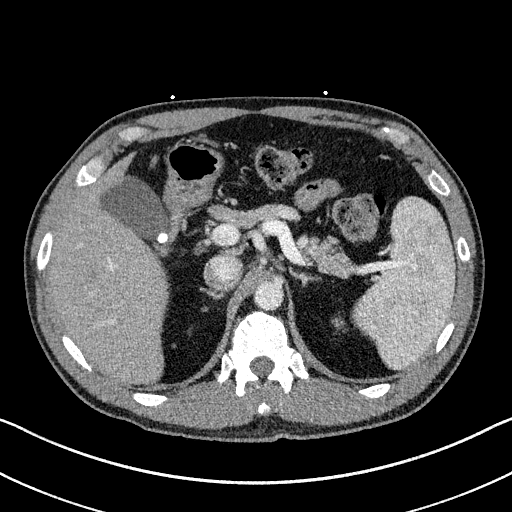
[im 11/143  lung]
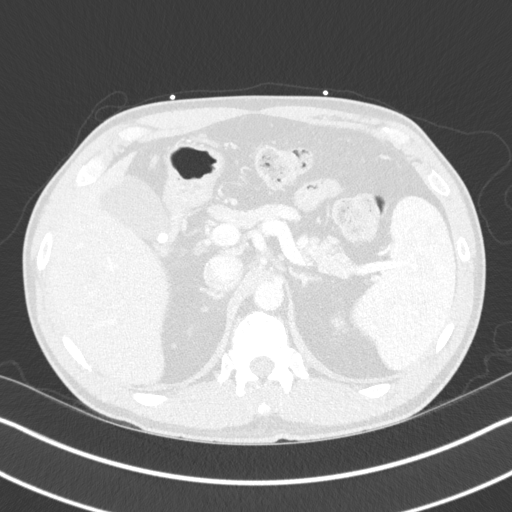
[im 22/143  lung]
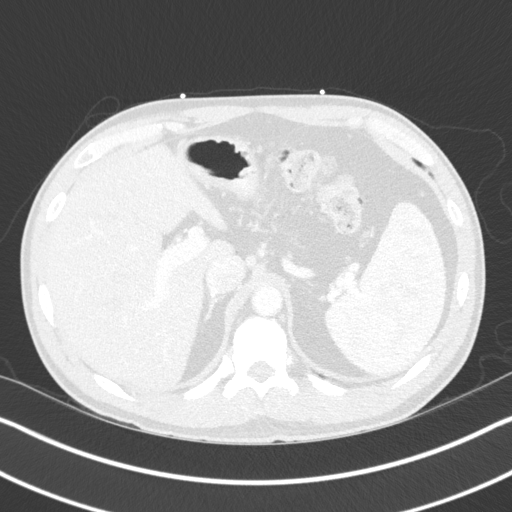
[im 32/143  lung]
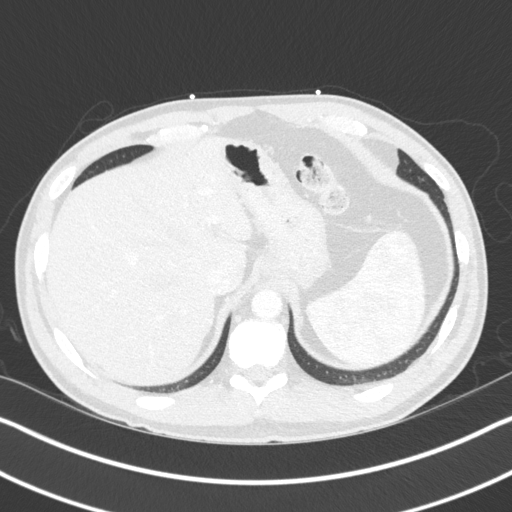
[im 43/143  lung]
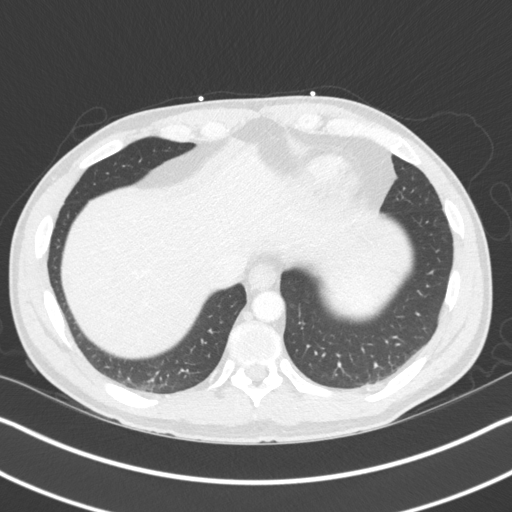
[im 53/143  mediastinal]
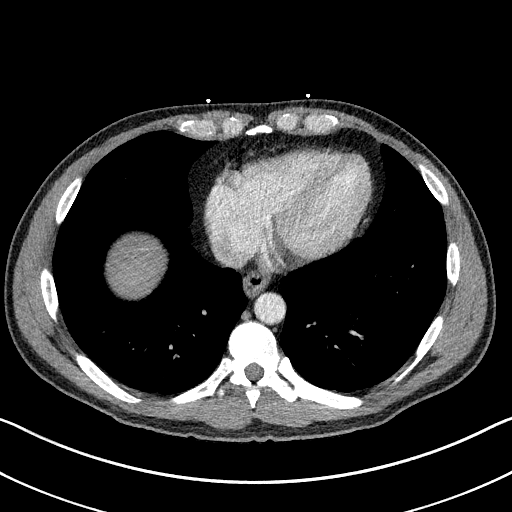
[im 53/143  lung]
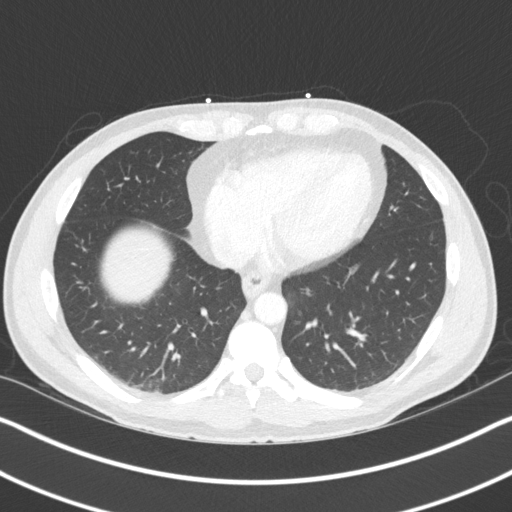
[im 64/143  lung]
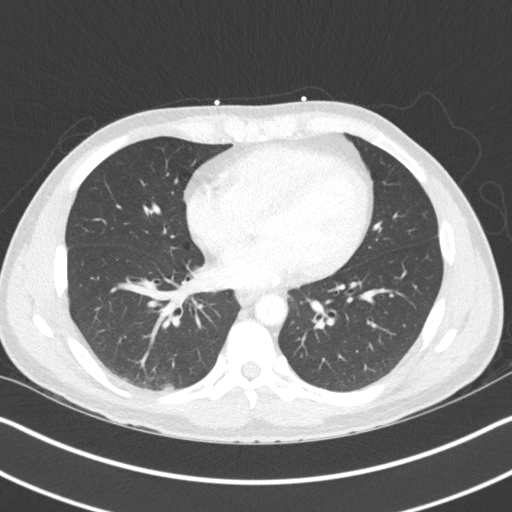
[im 79/143  lung]
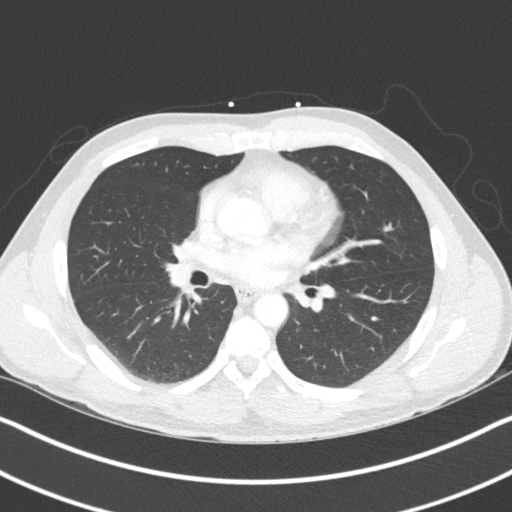
[im 90/143  lung]
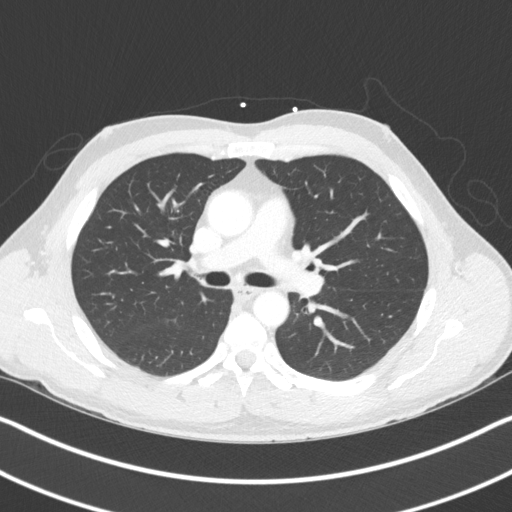
[im 100/143  mediastinal]
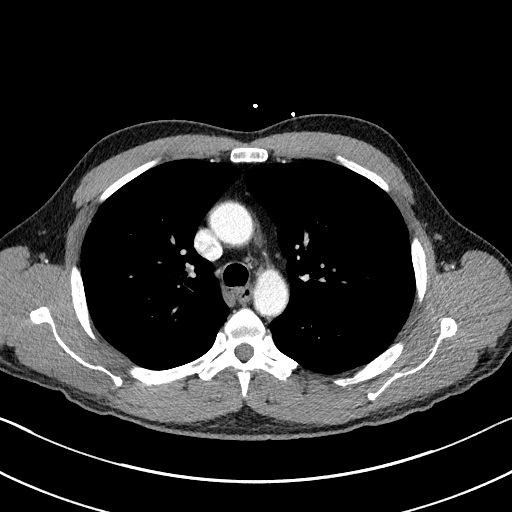
[im 100/143  lung]
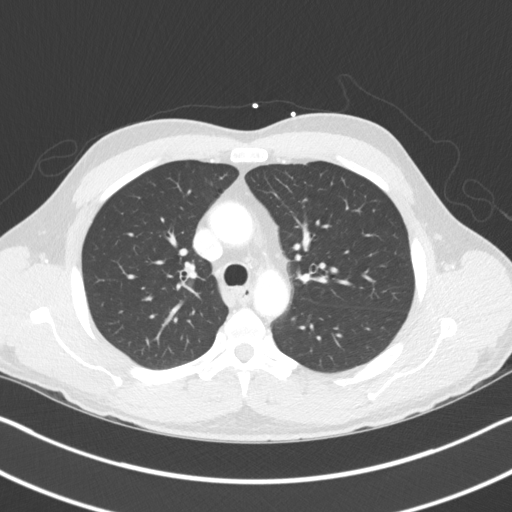
[im 111/143  lung]
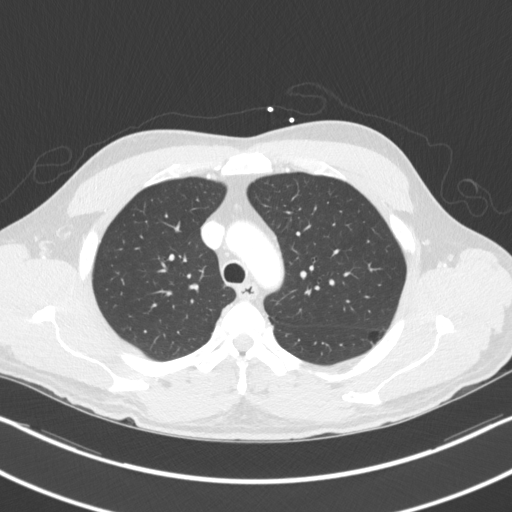
[im 121/143  lung]
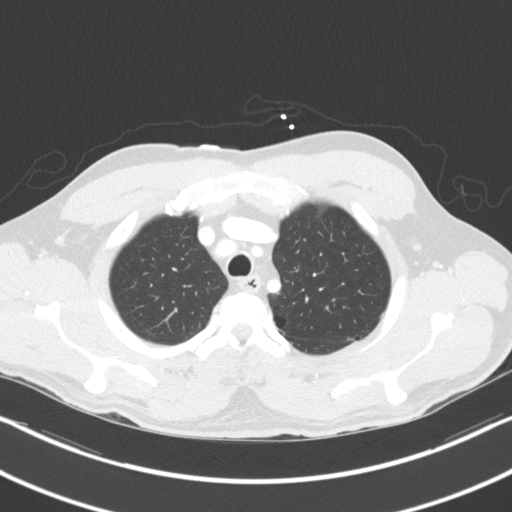
[im 132/143  lung]
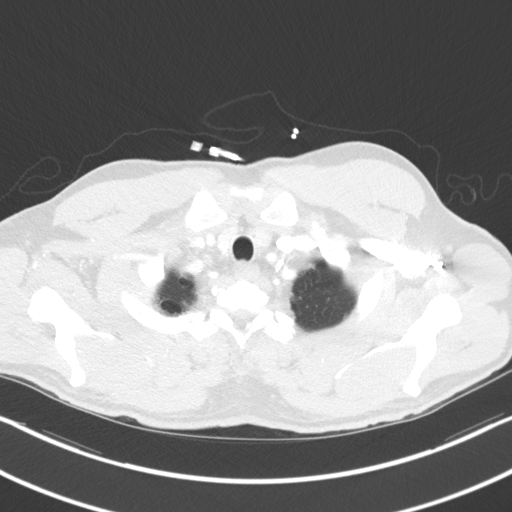

[Series 5: coronal · coronal · 0.61mm/px · 3 of 125 slices shown]
[im 25/125  lung]
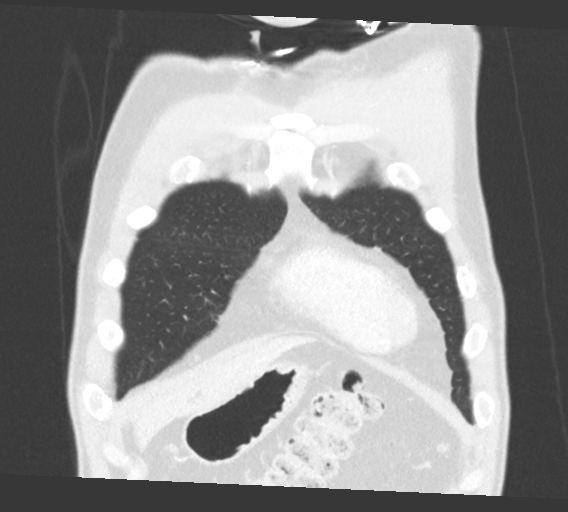
[im 50/125  lung]
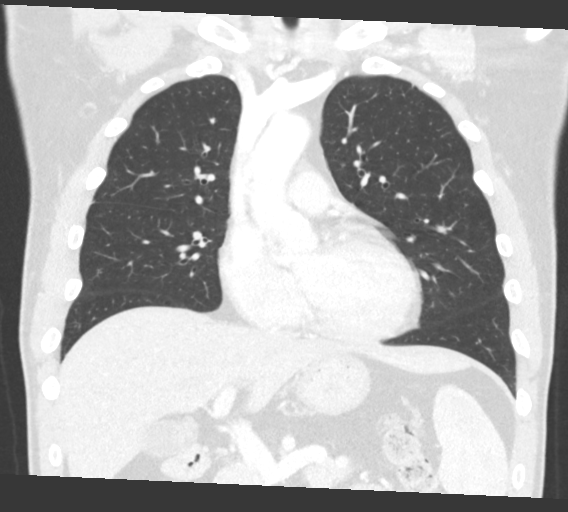
[im 75/125  lung]
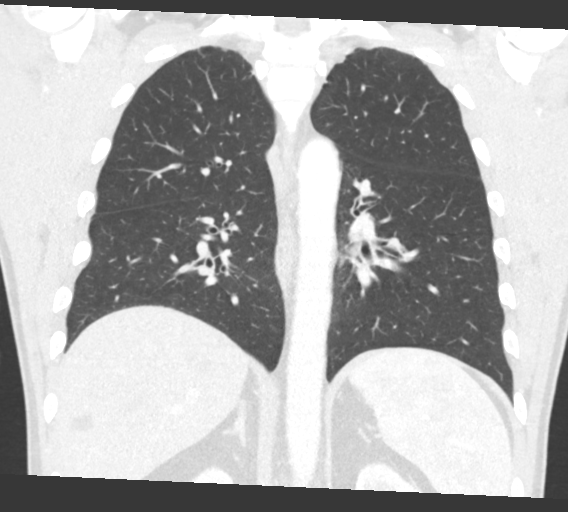

[15 of 36 positions shown; findings below may reference images not displayed]

FINDINGS: Cardiovascular: Heart is normal size. Mild calcified plaque over the
left anterior descending and right coronary arteries. Thoracic aorta
is within normal. Pulmonary arterial system is unremarkable.

Mediastinum/Nodes: No mediastinal or hilar adenopathy. Remaining
mediastinal structures are unremarkable.

Lungs/Pleura: Lungs are well inflated without focal airspace
consolidation or effusion. Airways are normal.

Upper Abdomen: 1.7 cm cyst over the right lobe of the liver. Single
1.2 cm gallstone. No acute findings.

Musculoskeletal: Normal.
IMPRESSION: No acute cardiopulmonary disease.

Mild atherosclerotic coronary artery disease.

1.2 cm gallstone.

1.7 cm right liver cyst.

## 2020-12-19 ENCOUNTER — Ambulatory Visit (INDEPENDENT_AMBULATORY_CARE_PROVIDER_SITE_OTHER): Payer: PRIVATE HEALTH INSURANCE | Admitting: Dermatology

## 2020-12-19 ENCOUNTER — Other Ambulatory Visit: Payer: Self-pay

## 2020-12-19 DIAGNOSIS — L578 Other skin changes due to chronic exposure to nonionizing radiation: Secondary | ICD-10-CM

## 2020-12-19 DIAGNOSIS — D229 Melanocytic nevi, unspecified: Secondary | ICD-10-CM | POA: Diagnosis not present

## 2020-12-19 DIAGNOSIS — L814 Other melanin hyperpigmentation: Secondary | ICD-10-CM

## 2020-12-19 DIAGNOSIS — D1800 Hemangioma unspecified site: Secondary | ICD-10-CM

## 2020-12-19 DIAGNOSIS — D489 Neoplasm of uncertain behavior, unspecified: Secondary | ICD-10-CM

## 2020-12-19 DIAGNOSIS — D485 Neoplasm of uncertain behavior of skin: Secondary | ICD-10-CM | POA: Diagnosis not present

## 2020-12-19 DIAGNOSIS — Z1283 Encounter for screening for malignant neoplasm of skin: Secondary | ICD-10-CM | POA: Diagnosis not present

## 2020-12-19 DIAGNOSIS — L821 Other seborrheic keratosis: Secondary | ICD-10-CM

## 2020-12-19 NOTE — Progress Notes (Signed)
   New Patient Visit  Subjective  James Ferguson is a 55 y.o. male who presents for the following: New Patient (Initial Visit) (Patient reports no family or personal history of skin cancer. Patient has concerns with mole on right temple. Patient has had cryotherapy on area. He states area scabbed up but area came back.  Patient states area doesn't bother him just wants checked. ).  Patient here for full body skin exam and skin cancer screening.  Objective  Well appearing patient in no apparent distress; mood and affect are within normal limits.  A full examination was performed including scalp, head, eyes, ears, nose, lips, neck, chest, axillae, abdomen, back, buttocks, bilateral upper extremities, bilateral lower extremities, hands, feet, fingers, toes, fingernails, and toenails. All findings within normal limits unless otherwise noted below.  Objective  right temple: 0.6 cm brown papule        Assessment & Plan  Neoplasm of uncertain behavior right temple  Skin / nail biopsy Type of biopsy: tangential   Informed consent: discussed and consent obtained   Timeout: patient name, date of birth, surgical site, and procedure verified   Patient was prepped and draped in usual sterile fashion: Area prepped with isopropyl alcohol. Anesthesia: the lesion was anesthetized in a standard fashion   Anesthetic:  0.5% bupivicaine w/ epinephrine 1-100,000 local infiltration Instrument used: flexible razor blade   Hemostasis achieved with: aluminum chloride   Outcome: patient tolerated procedure well   Post-procedure details: wound care instructions given   Additional details:  Mupirocin and a bandage applied  Specimen 1 - Surgical pathology Differential Diagnosis: r/o sk vs pigmented bcc  Check Margins: No 0.6 cm brown papule   r/o sk vs pigmented bcc   Lentigines - Scattered tan macules - Due to sun exposure - Benign-appering, observe - Recommend daily broad spectrum sunscreen SPF  30+ to sun-exposed areas, reapply every 2 hours as needed. - Call for any changes  Seborrheic Keratoses - Stuck-on, waxy, tan-brown papules and/or plaques  - Benign-appearing - Discussed benign etiology and prognosis. - Observe - Call for any changes  Melanocytic Nevi - Tan-brown and/or pink-flesh-colored symmetric macules and papules - Benign appearing on exam today - Observation - Call clinic for new or changing moles - Recommend daily use of broad spectrum spf 30+ sunscreen to sun-exposed areas.   Hemangiomas - Red papules - Discussed benign nature - Observe - Call for any changes  Actinic Damage - Chronic condition, secondary to cumulative UV/sun exposure - diffuse scaly erythematous macules with underlying dyspigmentation - Recommend daily broad spectrum sunscreen SPF 30+ to sun-exposed areas, reapply every 2 hours as needed.  - Staying in the shade or wearing long sleeves, sun glasses (UVA+UVB protection) and wide brim hats (4-inch brim around the entire circumference of the hat) are also recommended for sun protection.  - Call for new or changing lesions.  Skin cancer screening performed today.  Return in about 1 year (around 12/19/2021) for tbse.  I, Ruthell Rummage, CMA, am acting as scribe for Forest Gleason, MD.  Documentation: I have reviewed the above documentation for accuracy and completeness, and I agree with the above.  Forest Gleason, MD

## 2020-12-19 NOTE — Patient Instructions (Addendum)
Biopsy Wound Care Instructions  1. Leave the original bandage on for 24 hours if possible.  If the bandage becomes soaked or soiled before that time, it is OK to remove it and examine the wound.  A small amount of post-operative bleeding is normal.  If excessive bleeding occurs, remove the bandage, place gauze over the site and apply continuous pressure (no peeking) over the area for 30 minutes. If this does not work, please call our clinic as soon as possible or page your doctor if it is after hours.   2. Once a day, cleanse the wound with soap and water. It is fine to shower. If a thick crust develops you may use a Q-tip dipped into dilute hydrogen peroxide (mix 1:1 with water) to dissolve it.  Hydrogen peroxide can slow the healing process, so use it only as needed.    3. After washing, apply petroleum jelly (Vaseline) or an antibiotic ointment if your doctor prescribed one for you, followed by a bandage.    4. For best healing, the wound should be covered with a layer of ointment at all times. If you are not able to keep the area covered with a bandage to hold the ointment in place, this may mean re-applying the ointment several times a day.  Continue this wound care until the wound has healed and is no longer open.   Itching and mild discomfort is normal during the healing process. However, if you develop pain or severe itching, please call our office.   If you have any discomfort, you can take Tylenol (acetaminophen) or ibuprofen as directed on the bottle. (Please do not take these if you have an allergy to them or cannot take them for another reason).  Some redness, tenderness and white or yellow material in the wound is normal healing.  If the area becomes very sore and red, or develops a thick yellow-green material (pus), it may be infected; please notify us.    If you have stitches, return to clinic as directed to have the stitches removed. You will continue wound care for 2-3 days after  the stitches are removed.   Wound healing continues for up to one year following surgery. It is not unusual to experience pain in the scar from time to time during the interval.  If the pain becomes severe or the scar thickens, you should notify the office.    A slight amount of redness in a scar is expected for the first six months.  After six months, the redness will fade and the scar will soften and fade.  The color difference becomes less noticeable with time.  If there are any problems, return for a post-op surgery check at your earliest convenience.  To improve the appearance of the scar, you can use silicone scar gel, cream, or sheets (such as Mederma or Serica) every night for up to one year. These are available over the counter (without a prescription).  Please call our office at [phone] for any questions or concerns.   Recommend taking Heliocare sun protection supplement daily in sunny weather for additional sun protection. For maximum protection on the sunniest days, you can take up to 2 capsules of regular Heliocare OR take 1 capsule of Heliocare Ultra. For prolonged exposure (such as a full day in the sun), you can repeat your dose of the supplement 4 hours after your first dose. Heliocare can be purchased at [facility] or at [email or web address].   Melanoma ABCDEs  Melanoma is the most dangerous type of skin cancer, and is the leading cause of death from skin disease.  You are more likely to develop melanoma if you:  Have light-colored skin, light-colored eyes, or red or blond hair  Spend a lot of time in the sun  Tan regularly, either outdoors or in a tanning bed  Have had blistering sunburns, especially during childhood  Have a close family member who has had a melanoma  Have atypical moles or large birthmarks  Early detection of melanoma is key since treatment is typically straightforward and cure rates are extremely high if we catch it early.   The first  sign of melanoma is often a change in a mole or a new dark spot.  The ABCDE system is a way of remembering the signs of melanoma.  A for asymmetry:  The two halves do not match. B for border:  The edges of the growth are irregular. C for color:  A mixture of colors are present instead of an even brown color. D for diameter:  Melanomas are usually (but not always) greater than 108mm - the size of a pencil eraser. E for evolution:  The spot keeps changing in size, shape, and color.  Please check your skin once per month between visits. You can use a small mirror in front and a large mirror behind you to keep an eye on the back side or your body.   If you see any new or changing lesions before your next follow-up, please call to schedule a visit.  Please continue daily skin protection including broad spectrum sunscreen SPF 30+ to sun-exposed areas, reapplying every 2 hours as needed when you're outdoors.   Staying in the shade or wearing long sleeves, sun glasses (UVA+UVB protection) and wide brim hats (4-inch brim around the entire circumference of the hat) are also recommended for sun protection.   If you have any questions or concerns for your doctor, please call our main line at [phone] and press option 4 to reach your doctor's medical assistant. If no one answers, please leave a voicemail as directed and we will return your call as soon as possible. Messages left after 4 pm will be answered the following business day.   You may also send [place] a message via [place]. We typically respond to MyChart messages within 1-2 business days.  For prescription refills, please ask your pharmacy to contact our office. Our fax number is [phone].  If you have an urgent issue when the clinic is closed that cannot wait until the next business day, you can page your doctor at the number below.    Please note that while we do our best to be available for urgent issues outside of office hours, we are not  available 24/7.   If you have an urgent issue and are unable to reach [place], you may choose to seek medical care at your doctor's office, retail clinic, urgent care center, or emergency room.  If you have a medical emergency, please immediately call 911 or go to the emergency department.  Pager Numbers  - Dr. Nehemiah Massed: [phone]  - Dr. Laurence Ferrari: [phone]  - Dr. Nicole Kindred: [phone]  In the event of inclement weather, please call our main line at [phone] for an update on the status of any delays or closures.  Dermatology Medication Tips: Please keep the boxes that topical medications come in in order to help keep track of the instructions about where and how to use  these. Pharmacies typically print the medication instructions only on the boxes and not directly on the medication tubes.   If your medication is too expensive, please contact our office at [phone] option 4 or send [place] a message through [place].   We are unable to tell what your co-pay for medications will be in advance as this is different depending on your insurance coverage. However, we may be able to find a substitute medication at lower cost or fill out paperwork to get insurance to cover a needed medication.   If a prior authorization is required to get your medication covered by your insurance company, please allow [place] 1-2 business days to complete this process.  Drug prices often vary depending on where the prescription is filled and some pharmacies may offer cheaper prices.  The website www.goodrx.com contains coupons for medications through different pharmacies. The prices here do not account for what the cost may be with help from insurance (it may be cheaper with your insurance), but the website can give you the price if you did not use any insurance.  - You can print the associated coupon and take it with your prescription to the pharmacy.  - You may also stop by our office during regular business hours  and pick up a GoodRx coupon card.  - If you need your prescription sent electronically to a different pharmacy, notify our office through [facility] or by phone at [phone] option 4.

## 2020-12-26 ENCOUNTER — Telehealth: Payer: Self-pay

## 2020-12-26 NOTE — Telephone Encounter (Signed)
Patient called and notified of biopsy results. He verbalized understanding and denied further questions.

## 2020-12-26 NOTE — Telephone Encounter (Signed)
-----   Message from Alfonso Patten, MD sent at 12/25/2020  6:02 PM EDT ----- Skin , right temple [place]  This is a benign growth or "wisdom spot". No additional treatment is needed.   MAs please call. Thank you!

## 2020-12-27 ENCOUNTER — Encounter: Payer: Self-pay | Admitting: Dermatology

## 2021-03-07 ENCOUNTER — Ambulatory Visit: Payer: 59 | Admitting: Dermatology

## 2021-03-21 ENCOUNTER — Other Ambulatory Visit: Payer: Self-pay | Admitting: Family Medicine

## 2021-03-21 NOTE — Telephone Encounter (Signed)
Patient's wife called and advised the medications she's requesting is no longer on the list, they were taken off on 12/19/20 when he went to the dermatologist. She says they were prescribed for hiccups by Dr. Rosanna Randy and the medicine has been in the cabinet. She says the hiccups started back up today and he needs a refill on both medications. I advised I will send to Dr. Rosanna Randy for approval, she verbalized understanding.

## 2021-03-21 NOTE — Telephone Encounter (Signed)
Requested medication (s) are due for refill today: Yes  Requested medication (s) are on the active medication list: No  Last refill:  2019  Future visit scheduled: No  Notes to clinic:  Unable to refill per protocol, Rx expired. See notes attached, needs for hiccups.     Requested Prescriptions  Pending Prescriptions Disp Refills   baclofen (LIORESAL) 10 MG tablet 30 each 1    Sig: Take 1 tablet (10 mg total) by mouth 3 (three) times daily as needed for muscle spasms.      Not Delegated - Analgesics:  Muscle Relaxants Failed - 03/21/2021  5:49 PM      Failed - This refill cannot be delegated      Failed - Valid encounter within last 6 months    Recent Outpatient Visits           4 years ago Annual physical exam   [facility] Jerrol Banana., MD                  chlorproMAZINE (THORAZINE) 10 MG tablet 30 tablet 1    Sig: Take 1 tablet (10 mg total) by mouth 3 (three) times daily as needed.      Not Delegated - Psychiatry:  Antipsychotics - First Generation (Typical) Failed - 03/21/2021  5:49 PM      Failed - This refill cannot be delegated      Failed - Cr in normal range and within 360 days    Creatinine, Ser  Date Value Ref Range Status  08/28/2018 1.21 0.61 - 1.24 mg/dL Final          Failed - AST in normal range and within 360 days    AST  Date Value Ref Range Status  08/28/2018 25 15 - 41 U/L Final          Failed - ALT in normal range and within 360 days    ALT  Date Value Ref Range Status  08/28/2018 18 0 - 44 U/L Final          Failed - Valid encounter within last 6 months    Recent Outpatient Visits           4 years ago Annual physical exam   [facility] Jerrol Banana., MD

## 2021-03-21 NOTE — Telephone Encounter (Signed)
Copied from [place] [id number]. Topic: Quick Communication - Rx Refill/Question >> Mar 21, 2021  4:51 PM Tessa Lerner A wrote: Medication: chlorproMAZINE (THORAZINE) 10 MG tablet [[id number]]  baclofen (LIORESAL) 10 MG tablet   Has the patient contacted their pharmacy? Yes.   (Agent: If no, request that the patient contact the pharmacy for the refill.) (Agent: If yes, when and what did the pharmacy advise?)  Preferred Pharmacy (with phone number or street name): [facility], [place] [address] Phone: [phone] Fax: [phone] Hours: Not open 24 hours  Agent: Please be advised that RX refills may take up to 3 business days. We ask that you follow-up with your pharmacy.

## 2021-03-22 MED ORDER — CHLORPROMAZINE HCL 10 MG PO TABS: 10.0000 mg | ORAL_TABLET | Freq: Three times a day (TID) | ORAL | 1 refills | Status: DC | PRN

## 2021-03-22 MED ORDER — BACLOFEN 10 MG PO TABS: 10.0000 mg | ORAL_TABLET | Freq: Three times a day (TID) | ORAL | 1 refills | Status: DC | PRN

## 2021-06-17 ENCOUNTER — Encounter: Payer: Self-pay | Admitting: General Surgery

## 2021-11-06 ENCOUNTER — Telehealth: Payer: Self-pay | Admitting: Family Medicine

## 2021-11-06 NOTE — Telephone Encounter (Signed)
Disregard - pt's wife will call back to request Eye drops if necessary  ?

## 2021-11-07 ENCOUNTER — Ambulatory Visit: Payer: 59 | Admitting: Physician Assistant

## 2021-11-08 ENCOUNTER — Ambulatory Visit: Payer: Self-pay | Admitting: Physician Assistant

## 2021-11-08 NOTE — Progress Notes (Deleted)
?  ? ? ?  Established patient visit ? ? ?Patient: James Ferguson   DOB: 06/18/1966   56 y.o. Male  MRN: [id number] ?Visit Date: 11/08/2021 ? ?Today's healthcare provider: Dani Gobble Mecum, PA-C  ? ?No chief complaint on file. ? ?Subjective  ?  ?HPI  ?*** ? ?Medications: ?Outpatient Medications Prior to Visit  ?Medication Sig  ? baclofen (LIORESAL) 10 MG tablet Take 1 tablet (10 mg total) by mouth 3 (three) times daily as needed for muscle spasms.  ? chlorproMAZINE (THORAZINE) 10 MG tablet Take 1 tablet (10 mg total) by mouth 3 (three) times daily as needed.  ? diphenhydrAMINE (BENADRYL) 25 MG tablet Take 25 mg by mouth daily.  ? esomeprazole (NEXIUM) 20 MG capsule Take 20 mg by mouth daily.   ? ?No facility-administered medications prior to visit.  ? ? ?Review of Systems ? ?{Labs  Heme  Chem  Endocrine  Serology  Results Review (optional):23779} ?  Objective  ?  ?There were no vitals taken for this visit. ?{Show previous vital signs (optional):23777} ? ?Physical Exam  ?*** ? ?No results found for any visits on 11/08/21. ? Assessment & Plan  ?  ? ?*** ? ?No follow-ups on file.  ?   ? ?{provider attestation***:1} ? ? ?Erin E Mecum, PA-C  ?[place] ?[phone] (phone) ?[phone] (fax) ? ?[facility]  ?

## 2021-11-09 ENCOUNTER — Emergency Department (HOSPITAL_BASED_OUTPATIENT_CLINIC_OR_DEPARTMENT_OTHER)
Admission: EM | Admit: 2021-11-09 | Discharge: 2021-11-09 | Disposition: A | Discharge status: Home / Self Care | Payer: Managed Care, Other (non HMO) | Attending: Emergency Medicine | Admitting: Emergency Medicine

## 2021-11-09 ENCOUNTER — Other Ambulatory Visit: Payer: Self-pay

## 2021-11-09 ENCOUNTER — Encounter (HOSPITAL_BASED_OUTPATIENT_CLINIC_OR_DEPARTMENT_OTHER): Payer: Self-pay | Admitting: Emergency Medicine

## 2021-11-09 ENCOUNTER — Other Ambulatory Visit: Payer: Self-pay | Admitting: Family Medicine

## 2021-11-09 DIAGNOSIS — H04301 Unspecified dacryocystitis of right lacrimal passage: Secondary | ICD-10-CM | POA: Diagnosis not present

## 2021-11-09 DIAGNOSIS — H538 Other visual disturbances: Secondary | ICD-10-CM | POA: Diagnosis present

## 2021-11-09 MED ORDER — CLINDAMYCIN HCL 300 MG PO CAPS: 300.0000 mg | ORAL_CAPSULE | Freq: Three times a day (TID) | ORAL | 0 refills | Status: AC

## 2021-11-09 NOTE — ED Provider Notes (Signed)
?[facility] EMERGENC[facility] DEPT ?Provider Note ? ? ?CSN: [id number] ?Arrival date & time: 11/09/21  1437 ? ?  ? ?History ? ?Chief Complaint  ?Patient presents with  ? Eye Problem  ? ? ?James Ferguson is a 56 y.o. male. ? ?Patient with a 1 week history of swelling the medial aspect of the eye.  He went through a similar problem in December was told that he had a tear duct infection.  He was treated with oral antibiotics and eyedrops and it resolved.  Was given a referral to ophthalmology but never followed up because things got better.  It restarted in the same location this week.  Now with swelling redness no purulent discharge no pain with movement of the eye.  No fevers no headache.  Patient wears contacts but has had them out since this started.  He will continue to keep him out. ? ?Past medical history noncontributory. ? ? ?  ? ?Home Medications ?Prior to Admission medications   ?Medication Sig Start Date End Date Taking? Authorizing Provider  ?baclofen (LIORESAL) 10 MG tablet Take 1 tablet (10 mg total) by mouth 3 (three) times daily as needed for muscle spasms. 03/22/21   Jerrol Banana., MD  ?chlorproMAZINE (THORAZINE) 10 MG tablet Take 1 tablet (10 mg total) by mouth 3 (three) times daily as needed. 03/22/21   Jerrol Banana., MD  ?diphenhydrAMINE (BENADRYL) 25 MG tablet Take 25 mg by mouth daily.    [provider]  ?esomeprazole (NEXIUM) 20 MG capsule Take 20 mg by mouth daily.     [provider]  ?   ? ?Allergies    ?Patient has no known allergies.   ? ?Review of Systems   ?Review of Systems  ?Constitutional:  Negative for chills and fever.  ?HENT:  Positive for facial swelling. Negative for ear pain and sore throat.   ?Eyes:  Negative for photophobia, pain, discharge, redness, itching and visual disturbance.  ?Respiratory:  Negative for cough and shortness of breath.   ?Cardiovascular:  Negative for chest pain and palpitations.  ?Gastrointestinal:  Negative for  abdominal pain and vomiting.  ?Genitourinary:  Negative for dysuria and hematuria.  ?Musculoskeletal:  Negative for arthralgias and back pain.  ?Skin:  Negative for color change and rash.  ?Neurological:  Negative for seizures and syncope.  ?All other systems reviewed and are negative. ? ?Physical Exam ?Updated Vital Signs ?BP (!) 142/88 (BP Location: Right Arm)   Pulse 69   Temp 97.8 ?F (36.6 ?C) (Oral)   Resp 18   Ht 1.664 m (5' 5.5")   Wt 67.6 kg   SpO2 99%   BMI 24.42 kg/m?  ?Physical Exam ?Vitals and nursing note reviewed.  ?Constitutional:   ?   General: He is not in acute distress. ?   Appearance: Normal appearance. He is well-developed.  ?HENT:  ?   Head: Normocephalic and atraumatic.  ?Eyes:  ?   Extraocular Movements: Extraocular movements intact.  ?   Conjunctiva/sclera: Conjunctivae normal.  ?   Pupils: Pupils are equal, round, and reactive to light.  ?   Comments: Right eye with medial swelling no pointing of any purulent area.  No discharge.  Measures about 5 mm x 5 mm at the medial aspect where the lacrimal duct would be.  Good movement of the eyes without any pain.  No concerns for orbital cellulitis.  ?Cardiovascular:  ?   Rate and Rhythm: Normal rate and regular rhythm.  ?  Heart sounds: No murmur heard. ?Pulmonary:  ?   Effort: Pulmonary effort is normal. No respiratory distress.  ?   Breath sounds: Normal breath sounds.  ?Abdominal:  ?   Palpations: Abdomen is soft.  ?   Tenderness: There is no abdominal tenderness.  ?Musculoskeletal:     ?   General: No swelling.  ?   Cervical back: Normal range of motion and neck supple. No rigidity.  ?Skin: ?   General: Skin is warm and dry.  ?   Capillary Refill: Capillary refill takes less than 2 seconds.  ?Neurological:  ?   General: No focal deficit present.  ?   Mental Status: He is alert and oriented to person, place, and time.  ?   Cranial Nerves: No cranial nerve deficit.  ?   Sensory: No sensory deficit.  ?Psychiatric:     ?   Mood and Affect:  Mood normal.  ? ? ?ED Results / Procedures / Treatments   ?Labs ?(all labs ordered are listed, but only abnormal results are displayed) ?Labs Reviewed - No data to display ? ?EKG ?None ? ?Radiology ?No results found. ? ?Procedures ?Procedures  ? ? ?Medications Ordered in ED ?Medications - No data to display ? ?ED Course/ Medical Decision Making/ A&P ?  ?                        ?Medical Decision Making ?Risk ?Prescription drug management. ? ? ?Patient with recurrent problem from what happened in December.  This is consistent with tear duct infection.  Dacrycystitis. ? ?Discussed with Dr. Nancy Fetter on-call for ophthalmology.  She is okay with him being treated with clindamycin warm compresses and continuing and I drop that he picked up at urgent care yesterday which she has polymyxin B sulfate and trimethoprim. ? ?He will call her office on Monday and they will set up follow-up. ? ?No signs of any deep space infection or any signs of orbital infection or cellulitis.  Patient does wear contacts.  He has been keeping them out he will continue to keep them out and wear his glasses. ? ?He will take the clindamycin will continue use the eyedrops and he will use warm compresses.  And he will contact ophthalmology for follow-up. ?Final Clinical Impression(s) / ED Diagnoses ?Final diagnoses:  ?None  ? ? ?Rx / DC Orders ?ED Discharge Orders   ? ? None  ? ?  ? ? ?  ?Fredia Sorrow, MD ?11/09/21 1752 ? ?

## 2021-11-09 NOTE — Discharge Instructions (Addendum)
Take the antibiotic clindamycin as directed.  Continue to use the eyedrops.  Use warm compresses.  Give Dr. Asher Muir office a call on Monday morning and they will let you know when to come in. ? ? ?

## 2021-11-09 NOTE — ED Triage Notes (Signed)
Pt reports he was diagnosed with infected tear duct a week ago. Pt was given eye drops and oral abx. Started new eye drops last night from UC. Pt denies vision changes.  ?

## 2021-12-19 ENCOUNTER — Encounter: Payer: PRIVATE HEALTH INSURANCE | Admitting: Dermatology

## 2022-01-01 ENCOUNTER — Encounter (HOSPITAL_COMMUNITY): Payer: Self-pay | Admitting: Optometry

## 2022-01-01 ENCOUNTER — Other Ambulatory Visit: Payer: Self-pay

## 2022-01-01 NOTE — Progress Notes (Signed)
SDW CALL ? ?Patient was given pre-op instructions over the phone. The opportunity was given for the patient to ask questions. No further questions asked. Patient verbalized understanding of instructions given. ? ? ?PCP - [facility] ?Cardiologist - denies ? ?Chest x-ray - n/a ?EKG - n/a ?Stress Test - denies  ?ECHO - denies ?Cardiac Cath - denies ? ?Sleep Study - denies ?CPAP -  ? ?Blood Thinner Instructions: No NSAIDS ?Aspirin Instructions: ? ?ERAS Protcol - Clears until 8am ?PRE-SURGERY Ensure or G2-  ? ?COVID TEST- n/a ambulatory ? ?Anesthesia review: NO ? ?Patient denies shortness of breath, fever, cough and chest pain over the phone call ? ?Pt requested that I also call back to leave a detailed message on his voicemail for reference. Detailed message left on voicemail. ?

## 2022-01-03 ENCOUNTER — Other Ambulatory Visit: Payer: Self-pay

## 2022-01-03 ENCOUNTER — Encounter (HOSPITAL_COMMUNITY)
Admission: RE | Disposition: A | Discharge status: Home / Self Care | Payer: Self-pay | Source: Home / Self Care | Attending: Optometry

## 2022-01-03 ENCOUNTER — Ambulatory Visit (HOSPITAL_COMMUNITY): Payer: Managed Care, Other (non HMO) | Admitting: Anesthesiology

## 2022-01-03 ENCOUNTER — Encounter (HOSPITAL_COMMUNITY): Payer: Self-pay | Admitting: Optometry

## 2022-01-03 ENCOUNTER — Ambulatory Visit (HOSPITAL_BASED_OUTPATIENT_CLINIC_OR_DEPARTMENT_OTHER): Payer: Managed Care, Other (non HMO) | Admitting: Anesthesiology

## 2022-01-03 ENCOUNTER — Ambulatory Visit (HOSPITAL_COMMUNITY)
Admission: RE | Admit: 2022-01-03 | Discharge: 2022-01-03 | Disposition: A | Discharge status: Home / Self Care | Payer: Managed Care, Other (non HMO) | Attending: Optometry | Admitting: Optometry

## 2022-01-03 DIAGNOSIS — H04201 Unspecified epiphora, right lacrimal gland: Secondary | ICD-10-CM | POA: Diagnosis not present

## 2022-01-03 DIAGNOSIS — H04511 Dacryolith of right lacrimal passage: Secondary | ICD-10-CM

## 2022-01-03 DIAGNOSIS — H04301 Unspecified dacryocystitis of right lacrimal passage: Secondary | ICD-10-CM | POA: Diagnosis not present

## 2022-01-03 DIAGNOSIS — H04551 Acquired stenosis of right nasolacrimal duct: Secondary | ICD-10-CM | POA: Diagnosis present

## 2022-01-03 HISTORY — PX: LACRIMAL DUCT EXPLORATION: SHX6569

## 2022-01-03 HISTORY — DX: Other specified postprocedural states: Z98.890

## 2022-01-03 HISTORY — PX: DACRORHINOCYSTOTOMY: SHX5559

## 2022-01-03 HISTORY — DX: Nausea with vomiting, unspecified: R11.2

## 2022-01-03 HISTORY — DX: Gastro-esophageal reflux disease without esophagitis: K21.9

## 2022-01-03 SURGERY — EXPLORATION, LACRIMAL DUCT
Anesthesia: General | Site: Eye | Laterality: Right

## 2022-01-03 MED ORDER — OXYCODONE HCL 5 MG/5ML PO SOLN: 5.0000 mg | Freq: Once | ORAL | Status: AC | PRN

## 2022-01-03 MED ORDER — MIDAZOLAM HCL 5 MG/5ML IJ SOLN
INTRAMUSCULAR | Status: DC | PRN
  Administered 2022-01-03: 2 mg via INTRAVENOUS

## 2022-01-03 MED ORDER — MIDAZOLAM HCL 2 MG/2ML IJ SOLN
INTRAMUSCULAR | Status: AC
  Filled 2022-01-03: qty 2

## 2022-01-03 MED ORDER — OXYCODONE HCL 5 MG PO TABS
5.0000 mg | ORAL_TABLET | Freq: Once | ORAL | Status: AC | PRN
  Administered 2022-01-03: 5 mg via ORAL

## 2022-01-03 MED ORDER — PHENYLEPHRINE 80 MCG/ML (10ML) SYRINGE FOR IV PUSH (FOR BLOOD PRESSURE SUPPORT)
PREFILLED_SYRINGE | INTRAVENOUS | Status: AC
  Filled 2022-01-03: qty 10

## 2022-01-03 MED ORDER — TRIAMCINOLONE ACETONIDE 40 MG/ML IJ SUSP
INTRAMUSCULAR | Status: AC
  Filled 2022-01-03: qty 5

## 2022-01-03 MED ORDER — BUPIVACAINE HCL (PF) 0.75 % IJ SOLN
INTRAMUSCULAR | Status: AC
  Filled 2022-01-03: qty 10

## 2022-01-03 MED ORDER — PROPOFOL 10 MG/ML IV BOLUS
INTRAVENOUS | Status: DC | PRN
Start: 2022-01-03 — End: 2022-01-03
  Administered 2022-01-03: 10 mg via INTRAVENOUS
  Administered 2022-01-03: 30 mg via INTRAVENOUS
  Administered 2022-01-03: 170 mg via INTRAVENOUS

## 2022-01-03 MED ORDER — 0.9 % SODIUM CHLORIDE (POUR BTL) OPTIME
TOPICAL | Status: DC | PRN
  Administered 2022-01-03: 1000 mL

## 2022-01-03 MED ORDER — DEXAMETHASONE SODIUM PHOSPHATE 10 MG/ML IJ SOLN
INTRAMUSCULAR | Status: DC | PRN
  Administered 2022-01-03: 10 mg via INTRAVENOUS

## 2022-01-03 MED ORDER — STERILE WATER FOR IRRIGATION IR SOLN
Status: DC | PRN
  Administered 2022-01-03: 1000 mL

## 2022-01-03 MED ORDER — HYDROCODONE-ACETAMINOPHEN 5-325 MG PO TABS: 1.0000 | ORAL_TABLET | Freq: Four times a day (QID) | ORAL | 0 refills | Status: DC | PRN

## 2022-01-03 MED ORDER — CHLORHEXIDINE GLUCONATE 0.12 % MT SOLN
OROMUCOSAL | Status: AC
  Administered 2022-01-03: 15 mL
  Filled 2022-01-03: qty 15

## 2022-01-03 MED ORDER — DEXAMETHASONE SODIUM PHOSPHATE 10 MG/ML IJ SOLN
INTRAMUSCULAR | Status: AC
  Filled 2022-01-03: qty 1

## 2022-01-03 MED ORDER — LIDOCAINE 2% (20 MG/ML) 5 ML SYRINGE
INTRAMUSCULAR | Status: DC | PRN
  Administered 2022-01-03: 80 mg via INTRAVENOUS

## 2022-01-03 MED ORDER — BSS IO SOLN
INTRAOCULAR | Status: AC
  Filled 2022-01-03: qty 15

## 2022-01-03 MED ORDER — EPHEDRINE SULFATE-NACL 50-0.9 MG/10ML-% IV SOSY
PREFILLED_SYRINGE | INTRAVENOUS | Status: DC | PRN
Start: 2022-01-03 — End: 2022-01-03
  Administered 2022-01-03: 10 mg via INTRAVENOUS

## 2022-01-03 MED ORDER — ONDANSETRON HCL 4 MG/2ML IJ SOLN
INTRAMUSCULAR | Status: AC
  Filled 2022-01-03: qty 2

## 2022-01-03 MED ORDER — ERYTHROMYCIN 5 MG/GM OP OINT
TOPICAL_OINTMENT | OPHTHALMIC | Status: AC
  Filled 2022-01-03: qty 3.5

## 2022-01-03 MED ORDER — ONDANSETRON HCL 4 MG/2ML IJ SOLN: 4.0000 mg | Freq: Once | INTRAMUSCULAR | Status: DC | PRN

## 2022-01-03 MED ORDER — PROPOFOL 10 MG/ML IV BOLUS
INTRAVENOUS | Status: AC
  Filled 2022-01-03: qty 20

## 2022-01-03 MED ORDER — NEOMYCIN-POLYMYXIN-DEXAMETH 3.5-10000-0.1 OP OINT
TOPICAL_OINTMENT | OPHTHALMIC | Status: AC
  Filled 2022-01-03: qty 3.5

## 2022-01-03 MED ORDER — EPHEDRINE 5 MG/ML INJ
INTRAVENOUS | Status: AC
  Filled 2022-01-03: qty 5

## 2022-01-03 MED ORDER — LIDOCAINE 2% (20 MG/ML) 5 ML SYRINGE
INTRAMUSCULAR | Status: AC
  Filled 2022-01-03: qty 5

## 2022-01-03 MED ORDER — ERYTHROMYCIN 5 MG/GM OP OINT
1.0000 "application " | TOPICAL_OINTMENT | Freq: Once | OPHTHALMIC | Status: AC
  Administered 2022-01-03: 1 via OPHTHALMIC
  Filled 2022-01-03 (×2): qty 3.5

## 2022-01-03 MED ORDER — OXYMETAZOLINE HCL 0.05 % NA SOLN
NASAL | Status: DC | PRN
  Administered 2022-01-03: 1

## 2022-01-03 MED ORDER — FENTANYL CITRATE (PF) 100 MCG/2ML IJ SOLN: 25.0000 ug | INTRAMUSCULAR | Status: DC | PRN

## 2022-01-03 MED ORDER — FENTANYL CITRATE (PF) 250 MCG/5ML IJ SOLN
INTRAMUSCULAR | Status: AC
  Filled 2022-01-03: qty 5

## 2022-01-03 MED ORDER — TRIAMCINOLONE ACETONIDE 40 MG/ML IJ SUSP
INTRAMUSCULAR | Status: DC | PRN
Start: 2022-01-03 — End: 2022-01-03
  Administered 2022-01-03: 40 mg

## 2022-01-03 MED ORDER — AMISULPRIDE (ANTIEMETIC) 5 MG/2ML IV SOLN: 10.0000 mg | Freq: Once | INTRAVENOUS | Status: DC | PRN

## 2022-01-03 MED ORDER — ACETAMINOPHEN 500 MG PO TABS
1000.0000 mg | ORAL_TABLET | Freq: Once | ORAL | Status: AC
  Administered 2022-01-03: 1000 mg via ORAL
  Filled 2022-01-03: qty 2

## 2022-01-03 MED ORDER — HEMOSTATIC AGENTS (NO CHARGE) OPTIME
TOPICAL | Status: DC | PRN
  Administered 2022-01-03: 1 via TOPICAL

## 2022-01-03 MED ORDER — OXYMETAZOLINE HCL 0.05 % NA SOLN
1.0000 | NASAL | Status: AC
  Administered 2022-01-03 (×3): 1 via NASAL
  Filled 2022-01-03: qty 30

## 2022-01-03 MED ORDER — LACTATED RINGERS IV SOLN: INTRAVENOUS | Status: DC | PRN

## 2022-01-03 MED ORDER — OXYMETAZOLINE HCL 0.05 % NA SOLN
NASAL | Status: AC
  Filled 2022-01-03: qty 30

## 2022-01-03 MED ORDER — OXYCODONE HCL 5 MG PO TABS
ORAL_TABLET | ORAL | Status: AC
  Filled 2022-01-03: qty 1

## 2022-01-03 MED ORDER — ONDANSETRON HCL 4 MG/2ML IJ SOLN
INTRAMUSCULAR | Status: DC | PRN
  Administered 2022-01-03: 4 mg via INTRAVENOUS

## 2022-01-03 MED ORDER — LIDOCAINE-EPINEPHRINE 1 %-1:100000 IJ SOLN
INTRAMUSCULAR | Status: DC | PRN
Start: 2022-01-03 — End: 2022-01-03
  Administered 2022-01-03: 2 mL

## 2022-01-03 MED ORDER — FENTANYL CITRATE (PF) 250 MCG/5ML IJ SOLN
INTRAMUSCULAR | Status: DC | PRN
  Administered 2022-01-03: 50 ug via INTRAVENOUS
  Administered 2022-01-03 (×2): 25 ug via INTRAVENOUS

## 2022-01-03 MED ORDER — BACITRACIN ZINC 500 UNIT/GM EX OINT
TOPICAL_OINTMENT | CUTANEOUS | Status: AC
  Filled 2022-01-03: qty 28.35

## 2022-01-03 MED ORDER — NEOMYCIN-POLYMYXIN-DEXAMETH 3.5-10000-0.1 OP SUSP
1.0000 [drp] | Freq: Once | OPHTHALMIC | Status: AC
Start: 2022-01-03 — End: 2022-01-03
  Administered 2022-01-03: 1 [drp] via OPHTHALMIC
  Filled 2022-01-03 (×2): qty 5

## 2022-01-03 MED ORDER — PHENYLEPHRINE 80 MCG/ML (10ML) SYRINGE FOR IV PUSH (FOR BLOOD PRESSURE SUPPORT)
PREFILLED_SYRINGE | INTRAVENOUS | Status: DC | PRN
  Administered 2022-01-03: 80 ug via INTRAVENOUS
  Administered 2022-01-03 (×2): 40 ug via INTRAVENOUS

## 2022-01-03 SURGICAL SUPPLY — 51 items
APPLICATOR COTTON TIP 6 STRL (MISCELLANEOUS) ×1 IMPLANT
APPLICATOR COTTON TIP 6IN STRL (MISCELLANEOUS) ×2
BAG COUNTER SPONGE SURGICOUNT (BAG) ×2 IMPLANT
BLADE SURG 15 STRL LF DISP TIS (BLADE) ×1 IMPLANT
BLADE SURG 15 STRL SS (BLADE) ×2
BNDG COHESIVE 2X5 TAN ST LF (GAUZE/BANDAGES/DRESSINGS) IMPLANT
BUR DIAMOND COARSE 3.0 (BURR) IMPLANT
COAG SUCTION FOOTSWITCH 10FR (SUCTIONS) IMPLANT
COAGULATOR SUCT 8FR VV (MISCELLANEOUS) ×1 IMPLANT
CORD BIPOLAR FORCEPS 12FT (ELECTRODE) ×2 IMPLANT
COVER SURGICAL LIGHT HANDLE (MISCELLANEOUS) ×2 IMPLANT
DEFOGGER ANTIFOG KIT (MISCELLANEOUS) ×1 IMPLANT
DRAPE ORTHO SPLIT 77X108 STRL (DRAPES) ×2
DRAPE SURG ORHT 6 SPLT 77X108 (DRAPES) ×1 IMPLANT
ELECT NDL BLADE 2-5/6 (NEEDLE) IMPLANT
ELECT NEEDLE BLADE 2-5/6 (NEEDLE) IMPLANT
ELECT REM PT RETURN 9FT ADLT (ELECTROSURGICAL)
ELECTRODE REM PT RTRN 9FT ADLT (ELECTROSURGICAL) IMPLANT
FORCEPS BIPOLAR SPETZLER 8 1.0 (NEUROSURGERY SUPPLIES) ×2 IMPLANT
GAUZE 4X4 16PLY ~~LOC~~+RFID DBL (SPONGE) ×2 IMPLANT
GLOVE BIO SURGEON STRL SZ7.5 (GLOVE) ×4 IMPLANT
GLOVE SURG SYN 7.5  E (GLOVE) ×2
GLOVE SURG SYN 7.5 E (GLOVE) ×1 IMPLANT
GLOVE SURG SYN 7.5 PF PI (GLOVE) ×1 IMPLANT
GOWN STRL REUS W/ TWL LRG LVL3 (GOWN DISPOSABLE) ×2 IMPLANT
GOWN STRL REUS W/TWL LRG LVL3 (GOWN DISPOSABLE) ×4
KIT BASIN OR (CUSTOM PROCEDURE TRAY) ×2 IMPLANT
KNIFE CRESCENT 1.75 EDGEAHEAD (BLADE) ×3 IMPLANT
NDL PRECISIONGLIDE 27X1.5 (NEEDLE) ×2 IMPLANT
NEEDLE PRECISIONGLIDE 27X1.5 (NEEDLE) ×4 IMPLANT
NS IRRIG 1000ML POUR BTL (IV SOLUTION) ×2 IMPLANT
PACK CATARACT CUSTOM (CUSTOM PROCEDURE TRAY) ×2 IMPLANT
PAD ARMBOARD 7.5X6 YLW CONV (MISCELLANEOUS) ×4 IMPLANT
PATTIES SURGICAL .5 X3 (DISPOSABLE) ×2 IMPLANT
PENCIL BUTTON HOLSTER BLD 10FT (ELECTRODE) IMPLANT
SET INTBT LACRIMAL .016X.025 (DRAIN) ×2 IMPLANT
SPONGE SURGIFOAM ABS GEL 12-7 (HEMOSTASIS) ×3 IMPLANT
STAPLER VISISTAT (STAPLE) IMPLANT
SUT BONE WAX W31G (SUTURE) ×1 IMPLANT
SUT CHROMIC 4 0 S 4 (SUTURE) IMPLANT
SUT PLAIN 5 0 P 3 18 (SUTURE) IMPLANT
SUT PROLENE 6 0 CC (SUTURE) IMPLANT
SUT PROLENE 6 0 P 3 18 (SUTURE) ×2 IMPLANT
SUT VICRYL 6 0 S 29 12 (SUTURE) ×1 IMPLANT
SWAB COLLECTION DEVICE MRSA (MISCELLANEOUS) ×1 IMPLANT
SWAB CULTURE ESWAB REG 1ML (MISCELLANEOUS) ×1 IMPLANT
TOWEL GREEN STERILE FF (TOWEL DISPOSABLE) ×4 IMPLANT
TREPHINE OPHTH SISLER 21GA (BLADE) ×1 IMPLANT
TUBE CONNECTING 12X1/4 (SUCTIONS) ×2 IMPLANT
TUBING EXTENTION W/L.L. (IV SETS) IMPLANT
WATER STERILE IRR 1000ML POUR (IV SOLUTION) ×2 IMPLANT

## 2022-01-03 NOTE — Anesthesia Procedure Notes (Signed)
Procedure Name: LMA Insertion ?Date/Time: 01/03/2022 1:26 PM ?Performed by: Jenne Campus, CRNA ?Pre-anesthesia Checklist: Patient identified, Emergency Drugs available, Suction available and Patient being monitored ?Patient Re-evaluated:Patient Re-evaluated prior to induction ?Oxygen Delivery Method: Circle System Utilized ?Preoxygenation: Pre-oxygenation with 100% oxygen ?Induction Type: IV induction ?Ventilation: Mask ventilation without difficulty ?LMA: LMA inserted ?LMA Size: 4.0 ?Number of attempts: 1 ?Placement Confirmation: positive ETCO2 and breath sounds checked- equal and bilateral ?Tube secured with: Tape ?Dental Injury: Teeth and Oropharynx as per pre-operative assessment  ? ? ? ? ?

## 2022-01-03 NOTE — Transfer of Care (Signed)
Immediate Anesthesia Transfer of Care Note ? ?Patient: James Ferguson ? ?Procedure(s) Performed: LACRIMAL DUCT EXPLORATION WITH STENT PLACEMENT (Right: Eye) ?DACROCYSTORHINOSTOMY (DCR) (Right: Eye) ? ?Patient Location: PACU ? ?Anesthesia Type:General ? ?Level of Consciousness: oriented, drowsy and patient cooperative ? ?Airway & Oxygen Therapy: Patient Spontanous Breathing and Patient connected to nasal cannula oxygen ? ?Post-op Assessment: Report given to RN and Post -op Vital signs reviewed and stable ? ?Post vital signs: Reviewed ? ?Last Vitals:  ?Vitals Value Taken Time  ?BP 149/83 01/03/22 1526  ?Temp    ?Pulse 78 01/03/22 1527  ?Resp 11 01/03/22 1527  ?SpO2 96 % 01/03/22 1527  ?Vitals shown include unvalidated device data. ? ?Last Pain:  ?Vitals:  ? 01/03/22 0917  ?TempSrc:   ?PainSc: 0-No pain  ?   ? ?  ? ?Complications: No notable events documented. ?

## 2022-01-03 NOTE — Anesthesia Preprocedure Evaluation (Addendum)
Anesthesia Evaluation  ?Patient identified by MRN, date of birth, ID band ?Patient awake ? ? ? ?Reviewed: ?Allergy & Precautions, NPO status , Patient's Chart, lab work & pertinent test results ? ?History of Anesthesia Complications ?(+) PONV and history of anesthetic complications ? ?Airway ?Mallampati: II ? ?TM Distance: >3 FB ?Neck ROM: Full ? ? ? Dental ?no notable dental hx. ?(+) Teeth Intact, Dental Advisory Given ?  ?Pulmonary ?neg pulmonary ROS,  ?  ?Pulmonary exam normal ?breath sounds clear to auscultation ? ? ? ? ? ? Cardiovascular ?negative cardio ROS ?Normal cardiovascular exam ?Rhythm:Regular Rate:Normal ? ? ?  ?Neuro/Psych ?negative neurological ROS ? negative psych ROS  ? GI/Hepatic ?Neg liver ROS, GERD  Controlled,  ?Endo/Other  ?negative endocrine ROS ? Renal/GU ?negative Renal ROS  ?negative genitourinary ?  ?Musculoskeletal ?negative musculoskeletal ROS ?(+)  ? Abdominal ?  ?Peds ?negative pediatric ROS ?(+)  Hematology ?negative hematology ROS ?(+)   ?Anesthesia Other Findings ? ? Reproductive/Obstetrics ?negative OB ROS ? ?  ? ? ? ? ? ? ? ? ? ? ? ? ? ?  ?  ? ? ? ? ? ? ?Anesthesia Physical ?Anesthesia Plan ? ?ASA: 2 ? ?Anesthesia Plan: General  ? ?Post-op Pain Management: Tylenol PO (pre-op)*  ? ?Induction: Intravenous ? ?PONV Risk Score and Plan: 3 and Treatment may vary due to age or medical condition, Ondansetron, Midazolam and Dexamethasone ? ?Airway Management Planned: LMA ? ?Additional Equipment: None ? ?Intra-op Plan:  ? ?Post-operative Plan: Extubation in OR ? ?Informed Consent: I have reviewed the patients History and Physical, chart, labs and discussed the procedure including the risks, benefits and alternatives for the proposed anesthesia with the patient or authorized representative who has indicated his/her understanding and acceptance.  ? ? ? ?Dental advisory given ? ?Plan Discussed with: CRNA, Anesthesiologist and Surgeon ? ?Anesthesia Plan  Comments:   ? ? ? ? ? ? ?Anesthesia Quick Evaluation ? ?

## 2022-01-03 NOTE — Discharge Instructions (Signed)
Medicines: ?Maxitrol (neomycin/polymyxin/dexamethasone): Used 4 times per day in the right eye ?Flonase: Use twice per day in the right nostril ?Saline nasal rinses: Use as needed for any congestion ?Erythromycin ophthalmic ointment: Used on stitches three times daily and as needed for any irritation in the right eye ?Afrin: Use as needed for any nosebleeds ? ?Activities: ?No nose blowing ?No lifting, bending, or straining more than 8 pounds for 1 week ?Sneeze with your mouth open ?You may resume any blood thinners on the third day after surgery ? ?

## 2022-01-03 NOTE — Interval H&P Note (Signed)
History and Physical Interval Note: ? ?01/03/2022 ?9:57 AM ? 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  The various methods of treatment have been discussed with the patient and family. After consideration of risks, benefits and other options for treatment, the patient has consented to  Procedure(s): ?LACRIMAL DUCT EXPLORATION (Right) ?ENDOSCOPIC VS DACROCYSTORHINOSTOMY (DCR) (Right) as a surgical intervention.  The patient's history has been reviewed, patient examined, no change in status, stable for surgery.  I have reviewed the patient's chart and labs.  Questions were answered to the patient's satisfaction.   ? ? ?Delia Chimes ? ? ?

## 2022-01-03 NOTE — Op Note (Signed)
Operative Note ?PATIENT NAME:  James Ferguson ?MRN:  [id number] ?DATE OF SERVICE:  01/03/2022  ?DATE OF BIRTH:  05-21-1966 ? ?PREOPERATIVE DIAGNOSIS:   ?1. Nasolacrimal duct obstruction, right ?2. Epiphora, right ?3. Dacryocystitis, right ? ?POSTOPERATIVE DIAGNOSIS:  same ? ?PROCEDURE(S) PERFORMED:  ?1. Dacryocystorhinostomy, right CPT [phone] ?2. Nasolacrimal duct probe with placement of Crawford stent, right CPT [id number] ? ?SURGEON:  Delia Chimes, MD, PhD ?ASSISTANT:  none ? ?ANESTHESIA:  General ? ?FLUIDS GIVEN: Per Anesthesia  ? ?ESTIMATED BLOOD LOSS:  30 cc ? ?TUBES/DRAINS:  None ? ?SPECIMENS/CULTURES: right nasolacrimal sac contents  ? ?IMPLANTS: Crawford stent, right nasolacrimal system ? ?COMPLICATIONS:  None ? ?DESCRIPTION OF PROCEDURE: After obtaining informed consent, the patient was taken back to the operating room and laid supine on the operating room table.  Under monitored anesthesia care, the operative site(s) were anesthetized with 2% lidocaine with epinephrine and bupivicaine.  The nares were packed with Afrin-soaked pledgets.  The patient was then prepped and draped in the usual sterile fashion.  ? ?An incision was made in the lower lid and medial canthal area.  Hemostasis was obtained with bipolar cautery.  A combination of blunt and sharp dissection were made down to the anterior nasolacrimal crest. A Freer elevator was used to lift the periosteum and nasal mucosa from the bone.  Rongeurs were then used to remove bone in the area of the nasolacrimal sac.  The nasal mucosa was incised.  Once an adequate osteotomy had been made, the lower punctum of the eyelid was dilated and a 00 Bowman probe passed.  This probe was used to tent up the mucosa of the underlying nasolacrimal sac.  A crescent blade was then used to make an incision in the nasolacrimal sac.  The upper and lower puncta and canaliculi were then dilated and a bicanalicular Crawford stent was placed and retrieved in the nose.  A cotton tip was  placed underneath the loop of the stent to protect the canaliculi.  Tension was placed on the Crawford stents and then [facility] Gelfoam was passed to the ostomy.  A 6-0 Prolene was then used to tie the stents in the nose.  A additional knot was placed in the stents themselves distal to this 6-0 Prolene suture.  Kenalog-soaked gelfoam was passed on the stent to the ostium. The stent was allowed to retract back into the nose, the cotton tip applicator was removed from the medial canthus and the stents pulled to adequate tension.  The stents were then trimmed inside the nasal antrum.  The anterior flaps were then closed using 6-0 Vicryl suture.  The orbicularis was closed using 6-0 Vicryl suture.  The skin was closing 6-0 Prolene suture.  A drop of Maxitrol was placed in the operative eye. Ointment was placed in the operative eye(s) and incision.  ? ?The patient was then taken to the recovery room in stable condition.  ? ?Delia Chimes, MD, PhD ?Ophthalmic Plastic and Reconstructive Surgery ?[employer] ?[phone] (office) ? ?

## 2022-01-03 NOTE — Anesthesia Postprocedure Evaluation (Signed)
Anesthesia Post Note ? ?Patient: James Ferguson ? ?Procedure(s) Performed: LACRIMAL DUCT EXPLORATION WITH STENT PLACEMENT (Right: Eye) ?DACROCYSTORHINOSTOMY (DCR) (Right: Eye) ? ?  ? ?Patient location during evaluation: PACU ?Anesthesia Type: General ?Level of consciousness: awake and alert ?Pain management: pain level controlled ?Vital Signs Assessment: post-procedure vital signs reviewed and stable ?Respiratory status: spontaneous breathing, nonlabored ventilation, respiratory function stable and patient connected to nasal cannula oxygen ?Cardiovascular status: blood pressure returned to baseline and stable ?Postop Assessment: no apparent nausea or vomiting ?Anesthetic complications: no ? ? ?No notable events documented. ? ?Last Vitals:  ?Vitals:  ? 01/03/22 1540 01/03/22 1555  ?BP: (!) 147/83 (!) 145/83  ?Pulse: 73 77  ?Resp: 11 20  ?Temp:    ?SpO2: 98% 95%  ?  ?Last Pain:  ?Vitals:  ? 01/03/22 1555  ?TempSrc:   ?PainSc: 4   ? ? ?  ?  ?  ?  ?  ?  ? ?[name] James Ferguson ? ? ? ? ?

## 2022-01-04 ENCOUNTER — Encounter (HOSPITAL_COMMUNITY): Payer: Self-pay | Admitting: Optometry

## 2022-01-08 LAB — AEROBIC/ANAEROBIC CULTURE W GRAM STAIN (SURGICAL/DEEP WOUND)

## 2022-02-20 ENCOUNTER — Encounter: Payer: PRIVATE HEALTH INSURANCE | Admitting: Dermatology

## 2022-03-13 ENCOUNTER — Encounter: Payer: PRIVATE HEALTH INSURANCE | Admitting: Dermatology

## 2022-05-22 ENCOUNTER — Encounter: Payer: Self-pay | Admitting: Dermatology

## 2022-05-22 ENCOUNTER — Ambulatory Visit: Payer: Managed Care, Other (non HMO) | Admitting: Dermatology

## 2022-05-22 DIAGNOSIS — D485 Neoplasm of uncertain behavior of skin: Secondary | ICD-10-CM

## 2022-05-22 DIAGNOSIS — D1801 Hemangioma of skin and subcutaneous tissue: Secondary | ICD-10-CM

## 2022-05-22 DIAGNOSIS — L821 Other seborrheic keratosis: Secondary | ICD-10-CM

## 2022-05-22 DIAGNOSIS — L814 Other melanin hyperpigmentation: Secondary | ICD-10-CM

## 2022-05-22 DIAGNOSIS — Z1283 Encounter for screening for malignant neoplasm of skin: Secondary | ICD-10-CM

## 2022-05-22 DIAGNOSIS — L308 Other specified dermatitis: Secondary | ICD-10-CM

## 2022-05-22 DIAGNOSIS — D229 Melanocytic nevi, unspecified: Secondary | ICD-10-CM

## 2022-05-22 DIAGNOSIS — B078 Other viral warts: Secondary | ICD-10-CM | POA: Diagnosis not present

## 2022-05-22 DIAGNOSIS — L578 Other skin changes due to chronic exposure to nonionizing radiation: Secondary | ICD-10-CM | POA: Diagnosis not present

## 2022-05-22 DIAGNOSIS — D492 Neoplasm of unspecified behavior of bone, soft tissue, and skin: Secondary | ICD-10-CM

## 2022-05-22 NOTE — Progress Notes (Signed)
Follow-Up Visit   Subjective  James Ferguson is a 56 y.o. male who presents for the following: Annual Exam (Skin cancer screening. Full body. No hx of skin cancer). The patient presents for Total-Body Skin Exam (TBSE) for skin cancer screening and mole check.  The patient has spots, moles and lesions to be evaluated, some may be new or changing and the patient has concerns that these could be cancer.   The following portions of the chart were reviewed this encounter and updated as appropriate:  Tobacco  Allergies  Meds  Problems  Med Hx  Surg Hx  Fam Hx      Review of Systems: No other skin or systemic complaints except as noted in HPI or Assessment and Plan.   Objective  Well appearing patient in no apparent distress; mood and affect are within normal limits.  A full examination was performed including scalp, head, eyes, ears, nose, lips, neck, chest, axillae, abdomen, back, buttocks, bilateral upper extremities, bilateral lower extremities, hands, feet, fingers, toes, fingernails, and toenails. All findings within normal limits unless otherwise noted below.  Right Hand - Posterior Verrucous papules -- Discussed viral etiology and contagion.   B/L anterior lower legs Hypopigmented patches with scattered pink linear papules  Right Temple 0.45 cm pink papule with brown focus at 5 o'clock      Left Upper Back 0.3 cm dark brown papule      Assessment & Plan   Lentigines - Scattered tan macules - Due to sun exposure - Benign-appearing, observe - Recommend daily broad spectrum sunscreen SPF 30+ to sun-exposed areas, reapply every 2 hours as needed. - Call for any changes  Seborrheic Keratoses - Stuck-on, waxy, tan-brown papules and/or plaques  - Benign-appearing - Discussed benign etiology and prognosis. - Observe - Call for any changes  Melanocytic Nevi - Tan-brown and/or pink-flesh-colored symmetric macules and papules - Benign appearing on exam today -  Observation - Call clinic for new or changing moles - Recommend daily use of broad spectrum spf 30+ sunscreen to sun-exposed areas.   Hemangiomas - Red papules - Discussed benign nature - Observe - Call for any changes  Actinic Damage - Chronic condition, secondary to cumulative UV/sun exposure - diffuse scaly erythematous macules with underlying dyspigmentation - Recommend daily broad spectrum sunscreen SPF 30+ to sun-exposed areas, reapply every 2 hours as needed.  - Staying in the shade or wearing long sleeves, sun glasses (UVA+UVB protection) and wide brim hats (4-inch brim around the entire circumference of the hat) are also recommended for sun protection.  - Call for new or changing lesions.  Skin cancer screening performed today.  Verruca plana Right Hand - Posterior  Discussed viral etiology and risk of spread.  Discussed multiple treatments may be required to clear warts.  Discussed possible post-treatment dyspigmentation and risk of recurrence.  Patient deferred treatment at this time.   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 Currently well-controlled.  If patient calls in for Rx cream for itching will send Triamcinolone 0.1% ointment twice daily up to 2 weeks as needed for itching. Avoid applying to face, groin, and axilla. Use as directed. Long-term use can cause thinning of the skin.  Topical steroids (such as triamcinolone, fluocinolone, fluocinonide, mometasone, clobetasol, halobetasol, betamethasone, hydrocortisone) can cause thinning and lightening of the skin if they are used for too long in the same area. Your physician has selected the right  strength medicine for your problem and area affected on the body. Please use your medication only as directed by your physician to prevent side effects.      Neoplasm of skin (2) Right Temple  Epidermal / dermal shaving  Lesion  diameter (cm):  0.5 Informed consent: discussed and consent obtained   Patient was prepped and draped in usual sterile fashion: Area prepped with alcohol. Anesthesia: the lesion was anesthetized in a standard fashion   Anesthetic:  1% lidocaine w/ epinephrine 1-100,000 buffered w/ 8.4% NaHCO3 Instrument used: flexible razor blade   Hemostasis achieved with: pressure, aluminum chloride and electrodesiccation   Outcome: patient tolerated procedure well   Post-procedure details: wound care instructions given   Post-procedure details comment:  Ointment and small bandage applied  Specimen 1 - Surgical pathology Differential Diagnosis: R/O atypia  Check Margins: No  Left Upper Back  Epidermal / dermal shaving  Lesion diameter (cm):  0.3 Informed consent: discussed and consent obtained   Patient was prepped and draped in usual sterile fashion: Area prepped with alcohol. Anesthesia: the lesion was anesthetized in a standard fashion   Anesthetic:  1% lidocaine w/ epinephrine 1-100,000 buffered w/ 8.4% NaHCO3 Instrument used: flexible razor blade   Hemostasis achieved with: pressure, aluminum chloride and electrodesiccation   Outcome: patient tolerated procedure well   Post-procedure details: wound care instructions given   Post-procedure details comment:  Ointment and small bandage applied  Specimen 2 - Surgical pathology Differential Diagnosis: R/O atypia   Check Margins: No   Return in about 1 year (around 05/23/2023) for TBSE.  I, Emelia Salisbury, CMA, am acting as scribe for Forest Gleason, MD.  Documentation: I have reviewed the above documentation for accuracy and completeness, and I agree with the above.  Forest Gleason, MD

## 2022-05-22 NOTE — Patient Instructions (Addendum)
Wound Care Instructions  Cleanse wound gently with soap and water once a day then pat dry with clean gauze. Apply a thin coat of Petrolatum (petroleum jelly, "Vaseline") over the wound (unless you have an allergy to this). We recommend that you use a new, sterile tube of Vaseline. Do not pick or remove scabs. Do not remove the yellow or white "healing tissue" from the base of the wound.  Cover the wound with fresh, clean, nonstick gauze and secure with paper tape. You may use Band-Aids in place of gauze and tape if the wound is small enough, but would recommend trimming much of the tape off as there is often too much. Sometimes Band-Aids can irritate the skin.  You should call the office for your biopsy report after 1 week if you have not already been contacted.  If you experience any problems, such as abnormal amounts of bleeding, swelling, significant bruising, significant pain, or evidence of infection, please call the office immediately.  FOR ADULT SURGERY PATIENTS: If you need something for pain relief you may take 1 extra strength Tylenol (acetaminophen) AND 2 Ibuprofen (283m each) together every 4 hours as needed for pain. (do not take these if you are allergic to them or if you have a reason you should not take them.) Typically, you may only need pain medication for 1 to 3 days.       Recommend taking Heliocare sun protection supplement daily in sunny weather for additional sun protection. For maximum protection on the sunniest days, you can take up to 2 capsules of regular Heliocare OR take 1 capsule of Heliocare Ultra. For prolonged exposure (such as a full day in the sun), you can repeat your dose of the supplement 4 hours after your first dose. Heliocare can be purchased at [employer] at some [facility] or at [email or web address]     Recommend daily broad spectrum sunscreen SPF 30+ to sun-exposed areas, reapply every 2 hours as needed. Call for new or changing lesions.   Staying in the shade or wearing long sleeves, sun glasses (UVA+UVB protection) and wide brim hats (4-inch brim around the entire circumference of the hat) are also recommended for sun protection.    Melanoma ABCDEs  Melanoma is the most dangerous type of skin cancer, and is the leading cause of death from skin disease.  You are more likely to develop melanoma if you: Have light-colored skin, light-colored eyes, or red or blond hair Spend a lot of time in the sun Tan regularly, either outdoors or in a tanning bed Have had blistering sunburns, especially during childhood Have a close family member who has had a melanoma Have atypical moles or large birthmarks  Early detection of melanoma is key since treatment is typically straightforward and cure rates are extremely high if we catch it early.   The first sign of melanoma is often a change in a mole or a new dark spot.  The ABCDE system is a way of remembering the signs of melanoma.  A for asymmetry:  The two halves do not match. B for border:  The edges of the growth are irregular. C for color:  A mixture of colors are present instead of an even brown color. D for diameter:  Melanomas are usually (but not always) greater than 645m- the size of a pencil eraser. E for evolution:  The spot keeps changing in size, shape, and color.  Please check your skin once per month between visits. You can use  a small mirror in front and a large mirror behind you to keep an eye on the back side or your body.   If you see any new or changing lesions before your next follow-up, please call to schedule a visit.  Please continue daily skin protection including broad spectrum sunscreen SPF 30+ to sun-exposed areas, reapplying every 2 hours as needed when you're outdoors.   Staying in the shade or wearing long sleeves, sun glasses (UVA+UVB protection) and wide brim hats (4-inch brim around the entire circumference of the hat) are also recommended for sun  protection.

## 2022-05-27 ENCOUNTER — Telehealth: Payer: Self-pay

## 2022-05-27 NOTE — Telephone Encounter (Signed)
-----   Message from Alfonso Patten, MD sent at 05/27/2022 11:05 AM EDT ----- 1. Skin , right temple MELANOCYTIC NEVUS, COMPOUND TYPE, BASE INVOLVED -->  This is a NORMAL MOLE. No additional treatment is needed. If you notice any new or changing spots or have other skin concerns in future, please call our office at [phone].    2. Skin , left upper back PIGMENTED SEBORRHEIC KERATOSIS -->  This is a benign growth or "wisdom spot". No additional treatment is needed.   MAs please call. Thank you!

## 2022-05-27 NOTE — Telephone Encounter (Signed)
Discussed pathology results. Patient voiced understanding. JP

## 2022-05-30 ENCOUNTER — Encounter: Payer: Self-pay | Admitting: Dermatology

## 2023-01-06 ENCOUNTER — Institutional Professional Consult (permissible substitution): Payer: Managed Care, Other (non HMO) | Admitting: Pulmonary Disease

## 2023-01-13 ENCOUNTER — Encounter: Payer: Self-pay | Admitting: Pulmonary Disease

## 2023-01-13 ENCOUNTER — Ambulatory Visit: Payer: Managed Care, Other (non HMO) | Admitting: Pulmonary Disease

## 2023-01-13 VITALS — BP 118/64 | HR 76 | Ht 65.5 in | Wt 146.2 lb

## 2023-01-13 DIAGNOSIS — J683 Other acute and subacute respiratory conditions due to chemicals, gases, fumes and vapors: Secondary | ICD-10-CM | POA: Diagnosis not present

## 2023-01-13 DIAGNOSIS — R0982 Postnasal drip: Secondary | ICD-10-CM

## 2023-01-13 DIAGNOSIS — R053 Chronic cough: Secondary | ICD-10-CM

## 2023-01-13 LAB — CBC WITH DIFFERENTIAL/PLATELET
Basophils Absolute: 0 10*3/uL (ref 0.0–0.1)
Basophils Relative: 0.5 % (ref 0.0–3.0)
Eosinophils Absolute: 0.5 10*3/uL (ref 0.0–0.7)
Eosinophils Relative: 7.3 % — ABNORMAL HIGH (ref 0.0–5.0)
HCT: 42.9 % (ref 39.0–52.0)
Hemoglobin: 15 g/dL (ref 13.0–17.0)
Lymphocytes Relative: 10.6 % — ABNORMAL LOW (ref 12.0–46.0)
Lymphs Abs: 0.7 10*3/uL (ref 0.7–4.0)
MCHC: 35 g/dL (ref 30.0–36.0)
MCV: 86.3 fl (ref 78.0–100.0)
Monocytes Absolute: 0.4 10*3/uL (ref 0.1–1.0)
Monocytes Relative: 6.6 % (ref 3.0–12.0)
Neutro Abs: 4.9 10*3/uL (ref 1.4–7.7)
Neutrophils Relative %: 75 % (ref 43.0–77.0)
Platelets: 165 10*3/uL (ref 150.0–400.0)
RBC: 4.97 Mil/uL (ref 4.22–5.81)
RDW: 15.1 % (ref 11.5–15.5)
WBC: 6.5 10*3/uL (ref 4.0–10.5)

## 2023-01-13 MED ORDER — FLUTICASONE-SALMETEROL 250-50 MCG/ACT IN AEPB: 1.0000 | INHALATION_SPRAY | Freq: Two times a day (BID) | RESPIRATORY_TRACT | 2 refills | Status: DC

## 2023-01-13 MED ORDER — IPRATROPIUM BROMIDE 0.03 % NA SOLN: 2.0000 | Freq: Two times a day (BID) | NASAL | 12 refills | Status: DC

## 2023-01-13 NOTE — Progress Notes (Signed)
Synopsis: Referred in May 2024 for chronic cough by Julieanne Manson, MD  Subjective:   PATIENT ID: James Ferguson GENDER: male DOB: 04/23/1966, MRN: [id number]  HPI  Chief Complaint  Patient presents with   Consult    Referred by PCP for chronic cough since September 2023. Productive cough with thick, clear phlegm.    Nadeem Vahle is a 57 year old male, former smoker with history of GERD who is referred to pulmonary clinic for cough.   He reports persistent cough since 05/2022 that is productive with thick, clear phlegm. The cough is aggravated by laying down and temperature changes. The cough is alleviated with sitting up and inhaling steam. He can have significant cough episodes at any given time. He does have intermittent night time awakenings due to cough. He does have post-nasal drainage. He denies chest congestion. He feels mucous collecting in his throat, tightness, and intermittent wheezing that is alleviated by clearing of mucous. He is currently taking benedryl daily and flonase nasal spray.   Chest x-ray from 09/2022 was unremarkable.  He denies reflux symptoms. He reports increasing seasonal allergy symptoms with age with season changes. He has ENT visit in near future.    He is a never smoker. He lives with his family, dog and cat. He works as a Technical sales engineer. No second hand smoke history.   Past Medical History:  Diagnosis Date   GERD (gastroesophageal reflux disease)    PONV (postoperative nausea and vomiting)      Family History  Problem Relation Age of Onset   Migraines Mother    Fibromyalgia Mother    ALS Father      Social History   Socioeconomic History   Marital status: Married    Spouse name: Not on file   Number of children: 2   Years of education: Not on file   Highest education level: Not on file  Occupational History   Not on file  Tobacco Use   Smoking status: Never   Smokeless tobacco: Former    Types: Snuff   Tobacco comments:     smokeless pouches-2 to 3 pouches daily, since around 1980s off and on  Vaping Use   Vaping Use: Never used  Substance and Sexual Activity   Alcohol use: Yes    Comment: occasionally beer-2 or 3  per weekend   Drug use: No   Sexual activity: Yes  Other Topics Concern   Not on file  Social History Narrative   Not on file   Social Determinants of Health   Financial Resource Strain: Not on file  Food Insecurity: Not on file  Transportation Needs: Not on file  Physical Activity: Not on file  Stress: Not on file  Social Connections: Not on file  Intimate Partner Violence: Not on file     No Known Allergies   Outpatient Medications Prior to Visit  Medication Sig Dispense Refill   diphenhydrAMINE (BENADRYL) 25 MG tablet Take 25 mg by mouth daily as needed for allergies.     fluticasone (FLONASE) 50 MCG/ACT nasal spray Place 2 sprays into both nostrils daily.     doxycycline (VIBRAMYCIN) 50 MG capsule Take 50 mg by mouth daily.     HYDROcodone-acetaminophen (NORCO/VICODIN) 5-325 MG tablet Take 1 tablet by mouth every 6 (six) hours as needed for moderate pain. 10 tablet 0   No facility-administered medications prior to visit.   Review of Systems  Constitutional:  Negative for chills, fever, malaise/fatigue and weight  loss.  HENT:  Positive for congestion. Negative for sinus pain and sore throat.   Eyes: Negative.   Respiratory:  Positive for cough, sputum production and wheezing. Negative for hemoptysis and shortness of breath.   Cardiovascular:  Negative for chest pain, palpitations, orthopnea, claudication and leg swelling.  Gastrointestinal:  Negative for abdominal pain, heartburn, nausea and vomiting.  Genitourinary: Negative.   Musculoskeletal:  Negative for joint pain and myalgias.  Skin:  Negative for rash.  Neurological:  Negative for weakness.  Endo/Heme/Allergies:  Positive for environmental allergies.  Psychiatric/Behavioral: Negative.     Objective:   Vitals:    01/13/23 0922  BP: 118/64  Pulse: 76  SpO2: 99%  Weight: 146 lb 3.2 oz (66.3 kg)  Height: 5' 5.5" (1.664 m)     Physical Exam Constitutional:      General: He is not in acute distress. HENT:     Head: Normocephalic and atraumatic.  Eyes:     Conjunctiva/sclera: Conjunctivae normal.  Cardiovascular:     Rate and Rhythm: Normal rate and regular rhythm.     Pulses: Normal pulses.     Heart sounds: Normal heart sounds. No murmur heard. Pulmonary:     Effort: Pulmonary effort is normal.     Breath sounds: Normal breath sounds. No wheezing, rhonchi or rales.  Musculoskeletal:     Right lower leg: No edema.     Left lower leg: No edema.  Skin:    General: Skin is warm and dry.  Neurological:     General: No focal deficit present.     Mental Status: He is alert.    CBC    Component Value Date/Time   WBC 4.6 08/28/2018 1313   RBC 4.72 08/28/2018 1313   HGB 14.9 08/28/2018 1313   HGB 14.9 09/18/2016 0839   HCT 41.6 08/28/2018 1313   HCT 43.5 09/18/2016 0839   PLT 191 08/28/2018 1313   PLT 207 09/18/2016 0839   MCV 88.1 08/28/2018 1313   MCV 93 09/18/2016 0839   MCH 31.6 08/28/2018 1313   MCHC 35.8 08/28/2018 1313   RDW 12.5 08/28/2018 1313   RDW 14.0 09/18/2016 0839   LYMPHSABS 1.1 09/18/2016 0839   EOSABS 0.6 (H) 09/18/2016 0839   BASOSABS 0.0 09/18/2016 0839      Latest Ref Rng & Units 08/28/2018    1:13 PM 09/18/2016    8:39 AM  BMP  Glucose 70 - 99 mg/dL 95  87   BUN 6 - 20 mg/dL 16  16   Creatinine 1.61 - 1.24 mg/dL 0.96  0.45   BUN/Creat Ratio 9 - 20  15   Sodium 135 - 145 mmol/L 137  141   Potassium 3.5 - 5.1 mmol/L 4.0  4.7   Chloride 98 - 111 mmol/L 104  100   CO2 22 - 32 mmol/L 25  25   Calcium 8.9 - 10.3 mg/dL 9.0  9.4    Chest imaging: CXR 10/02/22 - [facility]  The lungs are clear and negative for focal airspace consolidation, pulmonary edema or suspicious pulmonary nodule. No pleural effusion or pneumothorax. Cardiac and mediastinal contours are  within normal limits. No acute fracture or lytic or blastic osseous lesions. The visualized upper abdominal bowel gas pattern is unremarkable.   PFT:     No data to display          Labs:  Path:  Echo:  Heart Catheterization:  Assessment & Plan:   Chronic cough - Plan: fluticasone-salmeterol (ADVAIR)  250-50 MCG/ACT AEPB, CBC with Differential/Platelet, IgE  Post-nasal drip - Plan: ipratropium (ATROVENT) 0.03 % nasal spray  Reactive airways dysfunction syndrome (HCC) - Plan: fluticasone-salmeterol (ADVAIR) 250-50 MCG/ACT AEPB, Pulmonary Function Test, CBC with Differential/Platelet, IgE  Discussion: Evo Gendron is a 57 year old male, former smoker with history of GERD who is referred to pulmonary clinic for cough.   The etiology of his chronic cough includes sinus congestion with post-nasal drainage vs reactive airways disease vs seasonal allergies vs silent GERD.   Chest imaging from 10/02/22 is unremarkable for infiltrates or opacities.  We will check CBC with differential and IgE levels today.   He is to continue fluticasone nasal spray and benedryl daily for allergies. Will add ipratropium nasal spray, 2 sprays per nostril daily. He has follow up with ENT this month, will defer CT of the sinuses to their judgement.   He is to start advair diskus 250-34mcg 1 puff twice daily for possible reactive airways disease.  We will consider empiric GERD treatment in the future if cough not improved.  Follow up in 2 months with pre/post spirometry.  [name], MD  Pulmonary & Critical Care Office: [phone]   Current Outpatient Medications:    diphenhydrAMINE (BENADRYL) 25 MG tablet, Take 25 mg by mouth daily as needed for allergies., Disp: , Rfl:    fluticasone (FLONASE) 50 MCG/ACT nasal spray, Place 2 sprays into both nostrils daily., Disp: , Rfl:    fluticasone-salmeterol (ADVAIR) 250-50 MCG/ACT AEPB, Inhale 1 puff into the lungs in the morning and at  bedtime., Disp: 60 each, Rfl: 2   ipratropium (ATROVENT) 0.03 % nasal spray, Place 2 sprays into both nostrils every 12 (twelve) hours., Disp: 30 mL, Rfl: 12

## 2023-01-13 NOTE — Patient Instructions (Addendum)
Start fluticasone-salmeterol inahler 250-35mcg 1 puff twice daily - rinse mouth out after each use  Start ipratropium nasal spray, 2 sprays per nostril twice daily  Continue fluticasone nasal spray, 2 sprays per nostril daily  Continue benedryl daily for allergies  We will check labs today  Follow up in 2 months with breathing tests

## 2023-01-14 LAB — IGE: IgE (Immunoglobulin E), Serum: 1152 kU/L — ABNORMAL HIGH (ref <=114)

## 2023-01-21 NOTE — Progress Notes (Signed)
Your Serum IgE level is elevated and your peripheral eosinophil count is elevated at 500. We will discuss this further at follow up and determine referral to Allergy/Immunology clinic.

## 2023-03-17 ENCOUNTER — Telehealth: Payer: Self-pay | Admitting: Pulmonary Disease

## 2023-03-17 NOTE — Telephone Encounter (Signed)
Pt calling in bc he has thrush

## 2023-03-18 ENCOUNTER — Encounter: Payer: Self-pay | Admitting: Pulmonary Disease

## 2023-03-19 ENCOUNTER — Ambulatory Visit (INDEPENDENT_AMBULATORY_CARE_PROVIDER_SITE_OTHER): Payer: Managed Care, Other (non HMO) | Admitting: Pulmonary Disease

## 2023-03-19 ENCOUNTER — Other Ambulatory Visit: Payer: Self-pay

## 2023-03-19 DIAGNOSIS — J683 Other acute and subacute respiratory conditions due to chemicals, gases, fumes and vapors: Secondary | ICD-10-CM | POA: Diagnosis not present

## 2023-03-19 LAB — PULMONARY FUNCTION TEST
DL/VA % pred: 98 %
DL/VA: 4.34 ml/min/mmHg/L
DLCO cor % pred: 100 %
DLCO cor: 24.25 ml/min/mmHg
DLCO unc % pred: 102 %
DLCO unc: 24.52 ml/min/mmHg
FEF 25-75 Post: 3.91 L/sec
FEF 25-75 Pre: 3.56 L/sec
FEF2575-%Change-Post: 9 %
FEF2575-%Pred-Post: 146 %
FEF2575-%Pred-Pre: 133 %
FEV1-%Change-Post: 1 %
FEV1-%Pred-Post: 106 %
FEV1-%Pred-Pre: 105 %
FEV1-Post: 3.3 L
FEV1-Pre: 3.26 L
FEV1FVC-%Change-Post: 4 %
FEV1FVC-%Pred-Pre: 106 %
FEV6-%Change-Post: -3 %
FEV6-%Pred-Post: 99 %
FEV6-%Pred-Pre: 103 %
FEV6-Post: 3.86 L
FEV6-Pre: 4 L
FEV6FVC-%Pred-Post: 104 %
FEV6FVC-%Pred-Pre: 104 %
FVC-%Change-Post: -3 %
FVC-%Pred-Post: 95 %
FVC-%Pred-Pre: 98 %
FVC-Post: 3.86 L
FVC-Pre: 4 L
Post FEV1/FVC ratio: 86 %
Post FEV6/FVC ratio: 100 %
Pre FEV1/FVC ratio: 82 %
Pre FEV6/FVC Ratio: 100 %
RV % pred: 97 %
RV: 1.86 L
TLC % pred: 101 %
TLC: 6.08 L

## 2023-03-19 MED ORDER — FLUCONAZOLE 150 MG PO TABS: 150.0000 mg | ORAL_TABLET | Freq: Every day | ORAL | 0 refills | Status: DC

## 2023-03-19 NOTE — Patient Instructions (Signed)
Full PFT performed today. °

## 2023-03-19 NOTE — Telephone Encounter (Signed)
Ok to send Diflucan 150 mg # 5, 1 by mouth daily x 5 days. It can also help to use the Advair inhaler, which has a steroid in it, before meals. That way chewing and swallowing help clear the Advair from the mouth.

## 2023-03-19 NOTE — Telephone Encounter (Signed)
Pt calling in bc he has thrush and message was sent via email. Will send again to dod.

## 2023-03-19 NOTE — Telephone Encounter (Signed)
I thought we responded to this earlier- I asked for diflucan 150 mg, # 5, 1 daily Also advised he usee his Advair before meals so the chewing and swallowing helps clear the medicine from his mouth to reduce chance of thrush.

## 2023-03-19 NOTE — Progress Notes (Signed)
Full PFT performed today. °

## 2023-03-20 ENCOUNTER — Telehealth: Payer: Self-pay | Admitting: Pulmonary Disease

## 2023-03-20 NOTE — Telephone Encounter (Signed)
PT states Dr. Was going to issue a new inhaler because the one he was on was giving him thrush. Was that done? They do not believe they he had that called in. I can not tell from the AVS notes which one was substituted and wife is confused.   Please call wife @ [phone]

## 2023-03-20 NOTE — Telephone Encounter (Signed)
There was no mention in prior notes about new inhaler. Dr. Maple Hudson handled prescription for the diflucan for the thrush with no mention of switching inhalers.   If he has issues again despite thoroughly rinsing his mouth, we can switch inhalers then.  [name], MD [facility] Office: [phone]

## 2023-03-31 ENCOUNTER — Ambulatory Visit (INDEPENDENT_AMBULATORY_CARE_PROVIDER_SITE_OTHER): Payer: Managed Care, Other (non HMO) | Admitting: Pulmonary Disease

## 2023-03-31 ENCOUNTER — Encounter: Payer: Self-pay | Admitting: Pulmonary Disease

## 2023-03-31 VITALS — BP 118/70 | HR 74 | Ht 65.5 in | Wt 144.0 lb

## 2023-03-31 DIAGNOSIS — R0982 Postnasal drip: Secondary | ICD-10-CM

## 2023-03-31 DIAGNOSIS — J683 Other acute and subacute respiratory conditions due to chemicals, gases, fumes and vapors: Secondary | ICD-10-CM | POA: Diagnosis not present

## 2023-03-31 DIAGNOSIS — R053 Chronic cough: Secondary | ICD-10-CM

## 2023-03-31 MED ORDER — FLUTICASONE-SALMETEROL 100-50 MCG/ACT IN AEPB: 1.0000 | INHALATION_SPRAY | Freq: Two times a day (BID) | RESPIRATORY_TRACT | 2 refills | Status: DC

## 2023-03-31 NOTE — Progress Notes (Signed)
Synopsis: Referred in May 2024 for chronic cough by Julieanne Manson, MD  Subjective:   PATIENT ID: James Ferguson GENDER: male DOB: 1965-12-15, MRN: [id number]  HPI  Chief Complaint  Patient presents with   Follow-up    F/U after PFT. States his breathing has improved since last visit.    James Ferguson is a 58 year old male, former smoker with history of GERD who returns to pulmonary clinic for cough.   OV 01/13/23 He reports persistent cough since 05/2022 that is productive with thick, clear phlegm. The cough is aggravated by laying down and temperature changes. The cough is alleviated with sitting up and inhaling steam. He can have significant cough episodes at any given time. He does have intermittent night time awakenings due to cough. He does have post-nasal drainage. He denies chest congestion. He feels mucous collecting in his throat, tightness, and intermittent wheezing that is alleviated by clearing of mucous. He is currently taking benedryl daily and flonase nasal spray.   Chest x-ray from 09/2022 was unremarkable.  He denies reflux symptoms. He reports increasing seasonal allergy symptoms with age with season changes. He has ENT visit in near future.    He is a never smoker. He lives with his family, dog and cat. He works as a Technical sales engineer. No second hand smoke history.   Today 03/31/23 He reports his cough has resolved since starting Advair discus 250-50 mcg 1 puff twice daily.  He was treated for thrush with Diflucan that was noted by his dentist.  PFTs are within normal limits today.   Past Medical History:  Diagnosis Date   GERD (gastroesophageal reflux disease)    PONV (postoperative nausea and vomiting)      Family History  Problem Relation Age of Onset   Migraines Mother    Fibromyalgia Mother    ALS Father      Social History   Socioeconomic History   Marital status: Married    Spouse name: Not on file   Number of children: 2   Years of education:  Not on file   Highest education level: Not on file  Occupational History   Not on file  Tobacco Use   Smoking status: Never   Smokeless tobacco: Former    Types: Snuff   Tobacco comments:    smokeless pouches-2 to 3 pouches daily, since around 1980s off and on  Vaping Use   Vaping status: Never Used  Substance and Sexual Activity   Alcohol use: Yes    Comment: occasionally beer-2 or 3  per weekend   Drug use: No   Sexual activity: Yes  Other Topics Concern   Not on file  Social History Narrative   Not on file   Social Determinants of Health   Financial Resource Strain: Not on file  Food Insecurity: Not on file  Transportation Needs: Not on file  Physical Activity: Not on file  Stress: Not on file  Social Connections: Not on file  Intimate Partner Violence: Not on file     No Known Allergies   Outpatient Medications Prior to Visit  Medication Sig Dispense Refill   fluconazole (DIFLUCAN) 150 MG tablet Take 1 tablet (150 mg total) by mouth daily. 5 tablet 0   fluticasone (FLONASE) 50 MCG/ACT nasal spray Place 2 sprays into both nostrils daily.     ipratropium (ATROVENT) 0.03 % nasal spray Place 2 sprays into both nostrils every 12 (twelve) hours. 30 mL 12   fluticasone-salmeterol (  ADVAIR) 250-50 MCG/ACT AEPB Inhale 1 puff into the lungs in the morning and at bedtime. 60 each 2   diphenhydrAMINE (BENADRYL) 25 MG tablet Take 25 mg by mouth daily as needed for allergies.     No facility-administered medications prior to visit.   Review of Systems  Constitutional:  Negative for chills, fever, malaise/fatigue and weight loss.  HENT:  Negative for congestion, sinus pain and sore throat.   Eyes: Negative.   Respiratory:  Negative for cough, hemoptysis, sputum production, shortness of breath and wheezing.   Cardiovascular:  Negative for chest pain, palpitations, orthopnea, claudication and leg swelling.  Gastrointestinal:  Negative for abdominal pain, heartburn, nausea and  vomiting.  Genitourinary: Negative.   Musculoskeletal:  Negative for joint pain and myalgias.  Skin:  Negative for rash.  Neurological:  Negative for weakness.  Psychiatric/Behavioral: Negative.     Objective:   Vitals:   03/31/23 1413  BP: 118/70  Pulse: 74  SpO2: 98%  Weight: 144 lb (65.3 kg)  Height: 5' 5.5" (1.664 m)     Physical Exam Constitutional:      General: He is not in acute distress.    Appearance: Normal appearance.  HENT:     Head: Normocephalic and atraumatic.  Eyes:     General: No scleral icterus.    Conjunctiva/sclera: Conjunctivae normal.  Cardiovascular:     Rate and Rhythm: Normal rate and regular rhythm.     Pulses: Normal pulses.     Heart sounds: Normal heart sounds. No murmur heard. Pulmonary:     Effort: Pulmonary effort is normal.     Breath sounds: Normal breath sounds. No wheezing, rhonchi or rales.  Musculoskeletal:     Right lower leg: No edema.     Left lower leg: No edema.  Skin:    General: Skin is warm and dry.  Neurological:     General: No focal deficit present.     Mental Status: He is alert.    CBC    Component Value Date/Time   WBC 6.5 01/13/2023 1014   RBC 4.97 01/13/2023 1014   HGB 15.0 01/13/2023 1014   HGB 14.9 09/18/2016 0839   HCT 42.9 01/13/2023 1014   HCT 43.5 09/18/2016 0839   PLT 165.0 01/13/2023 1014   PLT 207 09/18/2016 0839   MCV 86.3 01/13/2023 1014   MCV 93 09/18/2016 0839   MCH 31.6 08/28/2018 1313   MCHC 35.0 01/13/2023 1014   RDW 15.1 01/13/2023 1014   RDW 14.0 09/18/2016 0839   LYMPHSABS 0.7 01/13/2023 1014   LYMPHSABS 1.1 09/18/2016 0839   MONOABS 0.4 01/13/2023 1014   EOSABS 0.5 01/13/2023 1014   EOSABS 0.6 (H) 09/18/2016 0839   BASOSABS 0.0 01/13/2023 1014   BASOSABS 0.0 09/18/2016 0839      Latest Ref Rng & Units 08/28/2018    1:13 PM 09/18/2016    8:39 AM  BMP  Glucose 70 - 99 mg/dL 95  87   BUN 6 - 20 mg/dL 16  16   Creatinine 6.44 - 1.24 mg/dL 0.34  7.42   BUN/Creat Ratio 9  - 20  15   Sodium 135 - 145 mmol/L 137  141   Potassium 3.5 - 5.1 mmol/L 4.0  4.7   Chloride 98 - 111 mmol/L 104  100   CO2 22 - 32 mmol/L 25  25   Calcium 8.9 - 10.3 mg/dL 9.0  9.4    Chest imaging: CXR 10/02/22 - [facility]  The lungs  are clear and negative for focal airspace consolidation, pulmonary edema or suspicious pulmonary nodule. No pleural effusion or pneumothorax. Cardiac and mediastinal contours are within normal limits. No acute fracture or lytic or blastic osseous lesions. The visualized upper abdominal bowel gas pattern is unremarkable.   PFT:    Latest Ref Rng & Units 03/19/2023    9:00 AM  PFT Results  FVC-Pre L 4.00   FVC-Predicted Pre % 98   FVC-Post L 3.86   FVC-Predicted Post % 95   Pre FEV1/FVC % % 82   Post FEV1/FCV % % 86   FEV1-Pre L 3.26   FEV1-Predicted Pre % 105   FEV1-Post L 3.30   DLCO uncorrected ml/min/mmHg 24.52   DLCO UNC% % 102   DLCO corrected ml/min/mmHg 24.25   DLCO COR %Predicted % 100   DLVA Predicted % 98   TLC L 6.08   TLC % Predicted % 101   RV % Predicted % 97     Labs:  Path:  Echo:  Heart Catheterization:  Assessment & Plan:   Reactive airways dysfunction syndrome (HCC) - Plan: fluticasone-salmeterol (ADVAIR) 100-50 MCG/ACT AEPB  Chronic cough  Post-nasal drip  Discussion: James Ferguson is a 57 year old male, former smoker with history of GERD who returns to pulmonary clinic for cough.   The etiology of his chronic cough includes sinus congestion with post-nasal drainage vs reactive airways disease vs seasonal allergies vs silent GERD.   Chest imaging from 10/02/22 is unremarkable for infiltrates or opacities.  He has absolute eosinophil count of 500 and IgE level of 1,152.  His symptoms are much improved with using Advair discus 250-50 mcg 1 puff twice daily although complicated by thrush despite rinsing mouth out.  We will try reduced dose inhaler with Advair discus 100-50 mcg 1 puff twice daily and monitor for  further issues with thrush.  He is to continue fluticasone nasal spray and benedryl daily for allergies.  Recommend trial of ipratropium nasal spray and monitor for return of postnasal drainage symptoms.    Follow up in 6 months.  [name], MD [facility] Office: [phone]   Current Outpatient Medications:    fluconazole (DIFLUCAN) 150 MG tablet, Take 1 tablet (150 mg total) by mouth daily., Disp: 5 tablet, Rfl: 0   fluticasone (FLONASE) 50 MCG/ACT nasal spray, Place 2 sprays into both nostrils daily., Disp: , Rfl:    fluticasone-salmeterol (ADVAIR) 100-50 MCG/ACT AEPB, Inhale 1 puff into the lungs 2 (two) times daily., Disp: 60 each, Rfl: 2   ipratropium (ATROVENT) 0.03 % nasal spray, Place 2 sprays into both nostrils every 12 (twelve) hours., Disp: 30 mL, Rfl: 12

## 2023-03-31 NOTE — Patient Instructions (Addendum)
Start advair diskus 100-56mcg 1 puff twice daily - rinse mouth out thoroughly after each use  Stop advair 250-84mcg inhaler  Continue flonase nasal spray  Try to stop ipratropium nasal spray and monitor for return of symptoms  Follow up in 6 months

## 2023-04-01 NOTE — Telephone Encounter (Signed)
Patient seen in office by Dr. Francine Graven 04/01/23. Nothing further at this time.

## 2023-04-11 ENCOUNTER — Encounter: Payer: Self-pay | Admitting: Pulmonary Disease

## 2023-05-27 ENCOUNTER — Encounter: Payer: Managed Care, Other (non HMO) | Admitting: Dermatology

## 2023-06-02 ENCOUNTER — Encounter: Payer: Self-pay | Admitting: Dermatology

## 2023-06-02 ENCOUNTER — Ambulatory Visit (INDEPENDENT_AMBULATORY_CARE_PROVIDER_SITE_OTHER): Payer: Managed Care, Other (non HMO) | Admitting: Dermatology

## 2023-06-02 VITALS — BP 138/80 | HR 67

## 2023-06-02 DIAGNOSIS — L608 Other nail disorders: Secondary | ICD-10-CM

## 2023-06-02 DIAGNOSIS — L570 Actinic keratosis: Secondary | ICD-10-CM | POA: Diagnosis not present

## 2023-06-02 DIAGNOSIS — L309 Dermatitis, unspecified: Secondary | ICD-10-CM

## 2023-06-02 DIAGNOSIS — D239 Other benign neoplasm of skin, unspecified: Secondary | ICD-10-CM

## 2023-06-02 DIAGNOSIS — Z1283 Encounter for screening for malignant neoplasm of skin: Secondary | ICD-10-CM | POA: Diagnosis not present

## 2023-06-02 DIAGNOSIS — D1801 Hemangioma of skin and subcutaneous tissue: Secondary | ICD-10-CM

## 2023-06-02 DIAGNOSIS — D229 Melanocytic nevi, unspecified: Secondary | ICD-10-CM

## 2023-06-02 DIAGNOSIS — D2372 Other benign neoplasm of skin of left lower limb, including hip: Secondary | ICD-10-CM

## 2023-06-02 DIAGNOSIS — L821 Other seborrheic keratosis: Secondary | ICD-10-CM

## 2023-06-02 DIAGNOSIS — W908XXA Exposure to other nonionizing radiation, initial encounter: Secondary | ICD-10-CM

## 2023-06-02 DIAGNOSIS — L814 Other melanin hyperpigmentation: Secondary | ICD-10-CM

## 2023-06-02 DIAGNOSIS — L578 Other skin changes due to chronic exposure to nonionizing radiation: Secondary | ICD-10-CM

## 2023-06-02 NOTE — Progress Notes (Signed)
Follow-Up Visit   Subjective  James Ferguson is a 57 y.o. male who presents for the following: Skin Cancer Screening and Full Body Skin Exam  The patient presents for Total-Body Skin Exam (TBSE) for skin cancer screening and mole check. The patient has spots, moles and lesions to be evaluated, some may be new or changing. No history of skin cancer.  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 Not currently treating.    The following portions of the chart were reviewed this encounter and updated as appropriate: medications, allergies, medical history  Review of Systems:  No other skin or systemic complaints except as noted in HPI or Assessment and Plan.  Objective  Well appearing patient in no apparent distress; mood and affect are within normal limits.  A full examination was performed including scalp, head, eyes, ears, nose, lips, neck, chest, axillae, abdomen, back, buttocks, bilateral upper extremities, bilateral lower extremities, hands, feet, fingers, toes, fingernails, and toenails. All findings within normal limits unless otherwise noted below.   Relevant physical exam findings are noted in the Assessment and Plan.  Left Temple, Left antihelix Erythematous thin papules/macules with gritty scale.     Assessment & Plan   SKIN CANCER SCREENING PERFORMED TODAY.  ACTINIC DAMAGE - Chronic condition, secondary to cumulative UV/sun exposure - diffuse scaly erythematous macules with underlying dyspigmentation - Recommend daily broad spectrum sunscreen SPF 30+ to sun-exposed areas, reapply every 2 hours as needed.  - Staying in the shade or wearing long sleeves, sun glasses (UVA+UVB protection) and wide brim hats (4-inch brim around the entire circumference of the hat) are also recommended for sun protection.  - Call for new or changing lesions.  LENTIGINES, SEBORRHEIC KERATOSES, HEMANGIOMAS - Benign normal skin lesions -  Benign-appearing - Call for any changes  MELANOCYTIC NEVI - Tan-brown and/or pink-flesh-colored symmetric macules and papules - Benign appearing on exam today - Observation - Call clinic for new or changing moles - Recommend daily use of broad spectrum spf 30+ sunscreen to sun-exposed areas.   DERMATITIS Exam: Scaly pink papules left forearm.  Treatment Plan: No treatment, not bothersome to patient.   DERMATOFIBROMA Exam: Firm pink/brown papulenodule with dimple sign at sup to left lateral malleolus, left posterior lateral thigh  Treatment Plan: A dermatofibroma is a benign growth possibly related to trauma, such as an insect bite, cut from shaving, or inflamed acne-type bump.  Treatment options to remove include shave or excision with resulting scar and risk of recurrence.  Since benign-appearing and not bothersome, will observe for now.    Splinter hemorrhage Right Thumb Nail  Benign appearing. Area should grow out within 3 months.   Observation.  AK (actinic keratosis) Left Temple, Left antihelix  Offered cryotherapy vs observation. Patient prefers observation. No treatment today.  Actinic keratoses are precancerous spots that appear secondary to cumulative UV radiation exposure/sun exposure over time. They are chronic with expected duration over 1 year. A portion of actinic keratoses will progress to squamous cell carcinoma of the skin. It is not possible to reliably predict which spots will progress to skin cancer and so treatment is recommended to prevent development of skin cancer.  Recommend daily broad spectrum sunscreen SPF 30+ to sun-exposed areas, reapply every 2 hours as needed.  Recommend staying in the shade or wearing long sleeves, sun glasses (UVA+UVB protection) and wide brim hats (4-inch brim around the entire circumference of the hat). Call for new or changing  lesions.  Multiple benign nevi  Seborrheic keratoses  Lentigines  Actinic  elastosis  Cherry angioma  Dermatofibroma   Return in about 1 year (around 06/01/2024) for TBSE.  Wendee Beavers, CMA, am acting as scribe for Elie Goody, MD .   Documentation: I have reviewed the above documentation for accuracy and completeness, and I agree with the above.  Elie Goody, MD

## 2023-06-02 NOTE — Patient Instructions (Addendum)
Melanoma ABCDEs  Melanoma is the most dangerous type of skin cancer, and is the leading cause of death from skin disease.  You are more likely to develop melanoma if you: Have light-colored skin, light-colored eyes, or red or blond hair Spend a lot of time in the sun Tan regularly, either outdoors or in a tanning bed Have had blistering sunburns, especially during childhood Have a close family member who has had a melanoma Have atypical moles or large birthmarks  Early detection of melanoma is key since treatment is typically straightforward and cure rates are extremely high if we catch it early.   The first sign of melanoma is often a change in a mole or a new dark spot.  The ABCDE system is a way of remembering the signs of melanoma.  A for asymmetry:  The two halves do not match. B for border:  The edges of the growth are irregular. C for color:  A mixture of colors are present instead of an even brown color. D for diameter:  Melanomas are usually (but not always) greater than 6mm - the size of a pencil eraser. E for evolution:  The spot keeps changing in size, shape, and color.  Please check your skin once per month between visits. You can use a small mirror in front and a large mirror behind you to keep an eye on the back side or your body.   If you see any new or changing lesions before your next follow-up, please call to schedule a visit.  Please continue daily skin protection including broad spectrum sunscreen SPF 30+ to sun-exposed areas, reapplying every 2 hours as needed when you're outdoors.   Staying in the shade or wearing long sleeves, sun glasses (UVA+UVB protection) and wide brim hats (4-inch brim around the entire circumference of the hat) are also recommended for sun protection.    Due to recent changes in healthcare laws, you may see results of your pathology and/or laboratory studies on MyChart before the doctors have had a chance to review them. We understand that in  some cases there may be results that are confusing or concerning to you. Please understand that not all results are received at the same time and often the doctors may need to interpret multiple results in order to provide you with the best plan of care or course of treatment. Therefore, we ask that you please give [place] 2 business days to thoroughly review all your results before contacting the office for clarification. Should we see a critical lab result, you will be contacted sooner.   If You Need Anything After Your Visit  If you have any questions or concerns for your doctor, please call our main line at [phone] and press option 4 to reach your doctor's medical assistant. If no one answers, please leave a voicemail as directed and we will return your call as soon as possible. Messages left after 4 pm will be answered the following business day.   You may also send [place] a message via MyChart. We typically respond to MyChart messages within 1-2 business days.  For prescription refills, please ask your pharmacy to contact our office. Our fax number is [phone].  If you have an urgent issue when the clinic is closed that cannot wait until the next business day, you can page your doctor at the number below.    Please note that while we do our best to be available for urgent issues outside of office hours, we  are not available 24/7.   If you have an urgent issue and are unable to reach [place], you may choose to seek medical care at your doctor's office, retail clinic, urgent care center, or emergency room.  If you have a medical emergency, please immediately call 911 or go to the emergency department.  Pager Numbers  - Dr. Gwen Pounds: [phone]  - Dr. Roseanne Reno: [phone]  - Dr. Katrinka Blazing: [phone]   In the event of inclement weather, please call our main line at [phone] for an update on the status of any delays or closures.  Dermatology Medication Tips: Please keep the boxes that  topical medications come in in order to help keep track of the instructions about where and how to use these. Pharmacies typically print the medication instructions only on the boxes and not directly on the medication tubes.   If your medication is too expensive, please contact our office at [phone] option 4 or send [place] a message through MyChart.   We are unable to tell what your co-pay for medications will be in advance as this is different depending on your insurance coverage. However, we may be able to find a substitute medication at lower cost or fill out paperwork to get insurance to cover a needed medication.   If a prior authorization is required to get your medication covered by your insurance company, please allow [place] 1-2 business days to complete this process.  Drug prices often vary depending on where the prescription is filled and some pharmacies may offer cheaper prices.  The website www.goodrx.com contains coupons for medications through different pharmacies. The prices here do not account for what the cost may be with help from insurance (it may be cheaper with your insurance), but the website can give you the price if you did not use any insurance.  - You can print the associated coupon and take it with your prescription to the pharmacy.  - You may also stop by our office during regular business hours and pick up a GoodRx coupon card.  - If you need your prescription sent electronically to a different pharmacy, notify our office through [facility] or by phone at [phone] option 4.     Si Usted Necesita Algo Despus de Su Visita  Tambin puede enviarnos un mensaje a travs de Clinical cytogeneticist. Por lo general respondemos a los mensajes de MyChart en el transcurso de 1 a 2 das hbiles.  Para renovar recetas, por favor pida a su farmacia que se ponga en contacto con nuestra oficina. Annie Sable de fax es [place] [phone].  Si tiene un asunto urgente cuando la clnica  est cerrada y que no puede esperar hasta el siguiente da hbil, puede llamar/localizar a su doctor(a) al nmero que aparece a continuacin.   Por favor, tenga en cuenta que aunque hacemos todo lo posible para estar disponibles para asuntos urgentes fuera del horario de [place], no estamos disponibles las 24 horas del da, los 7 [address]  [address] de la [place].   Si tiene un problema urgente y no puede comunicarse con nosotros, puede optar por buscar atencin mdica  en el consultorio de su doctor(a), en una clnica privada, en un centro de atencin urgente o en una sala de emergencias.  Si tiene Engineer, drilling, por favor llame inmediatamente al 911 o vaya a la sala de emergencias.  Nmeros de bper  - Dr. Gwen Pounds: [phone]  - Dra. Roseanne Reno: [phone]  - Dr. Katrinka Blazing: [phone]   En caso de inclemencias del  tiempo, por favor llame a Ferne Co[place] lnea principal al [phone] para una actualizacin sobre el [place] de cualquier retraso o cierre.  Consejos para la medicacin en dermatologa: Por favor, guarde las cajas en las que vienen los medicamentos de uso tpico para ayudarle a seguir las instrucciones sobre dnde y cmo usarlos. Las farmacias generalmente imprimen las instrucciones del medicamento slo en las cajas y no directamente en los tubos del [place].   Si su medicamento es muy caro, por favor, pngase en contacto con Rolm Gala llamando al [phone] y presione la opcin 4 o envenos un mensaje a travs de Clinical cytogeneticist.   No podemos decirle cul ser su copago por los medicamentos por adelantado ya que esto es diferente dependiendo de la cobertura de su seguro. Sin embargo, es posible que podamos encontrar un medicamento sustituto a Audiological scientist un formulario para que el seguro cubra el medicamento que se considera necesario.   Si se requiere una autorizacin previa para que su compaa de seguros [place] su medicamento, por favor permtanos de 1 a 2 das hbiles para  completar [address]  Los precios de los medicamentos varan con frecuencia dependiendo del Environmental consultant de dnde se surte la receta y alguna farmacias pueden ofrecer precios ms baratos.  El sitio web www.goodrx.com tiene cupones para medicamentos de Health and safety inspector. Los precios aqu no tienen en cuenta lo que podra costar con la ayuda del seguro (puede ser ms barato con su seguro), pero el sitio web puede darle el precio si no utiliz Tourist information centre manager.  - Puede imprimir el cupn correspondiente y llevarlo con su receta a la farmacia.  - Tambin puede pasar por nuestra oficina durante el horario de atencin regular y Education officer, museum una tarjeta de cupones de GoodRx.  - Si necesita que su receta se enve electrnicamente a una farmacia diferente, informe a nuestra oficina a travs de MyChart de [place] o por telfono llamando al [phone] y presione la opcin 4.

## 2023-10-09 ENCOUNTER — Other Ambulatory Visit: Payer: Self-pay | Admitting: Pulmonary Disease

## 2023-10-09 DIAGNOSIS — R053 Chronic cough: Secondary | ICD-10-CM

## 2023-10-09 DIAGNOSIS — J683 Other acute and subacute respiratory conditions due to chemicals, gases, fumes and vapors: Secondary | ICD-10-CM

## 2023-10-12 ENCOUNTER — Telehealth: Payer: Self-pay | Admitting: Pulmonary Disease

## 2023-10-12 ENCOUNTER — Other Ambulatory Visit: Payer: Self-pay | Admitting: Pulmonary Disease

## 2023-10-12 DIAGNOSIS — J683 Other acute and subacute respiratory conditions due to chemicals, gases, fumes and vapors: Secondary | ICD-10-CM

## 2023-10-12 MED ORDER — FLUTICASONE-SALMETEROL 100-50 MCG/ACT IN AEPB: 1.0000 | INHALATION_SPRAY | Freq: Two times a day (BID) | RESPIRATORY_TRACT | 2 refills | Status: DC

## 2023-10-12 NOTE — Telephone Encounter (Signed)
I called and spoke with the pt's spouse, ok per DPR  I have refilled advair until next visit scheduled for 11/25/23  Nothing further needed

## 2023-10-12 NOTE — Telephone Encounter (Signed)
 Patients wife is returning missed call

## 2023-10-12 NOTE — Telephone Encounter (Signed)
Christy wife states patient needs refill for Advair. Needs the lesser strength.Patient is out of the medication.  Pharmacy is [facility]. Christy phone number is [phone].

## 2023-10-12 NOTE — Telephone Encounter (Signed)
Lm x1 for patient's spouse, Christy(DPR)

## 2023-11-25 ENCOUNTER — Encounter: Payer: Self-pay | Admitting: Pulmonary Disease

## 2023-11-25 ENCOUNTER — Ambulatory Visit: Payer: Self-pay | Admitting: Pulmonary Disease

## 2023-11-25 VITALS — BP 143/84 | HR 84 | Ht 65.5 in | Wt 140.6 lb

## 2023-11-25 DIAGNOSIS — J4520 Mild intermittent asthma, uncomplicated: Secondary | ICD-10-CM

## 2023-11-25 NOTE — Progress Notes (Unsigned)
 Synopsis: Referred in May 2024 for chronic cough by Julieanne Manson, MD  Subjective:   PATIENT ID: James Ferguson GENDER: male DOB: [birth year], MRN: [id number]  HPI  Chief Complaint  Patient presents with   Follow-up    Pt states he has not been consistent with his inhaler   James Ferguson is a 58 year old male, former smoker with history of GERD who returns to pulmonary clinic for cough.   OV 01/13/23 He reports persistent cough since 05/2022 that is productive with thick, clear phlegm. The cough is aggravated by laying down and temperature changes. The cough is alleviated with sitting up and inhaling steam. He can have significant cough episodes at any given time. He does have intermittent night time awakenings due to cough. He does have post-nasal drainage. He denies chest congestion. He feels mucous collecting in his throat, tightness, and intermittent wheezing that is alleviated by clearing of mucous. He is currently taking benedryl daily and flonase nasal spray.   Chest x-ray from 09/2022 was unremarkable.  He denies reflux symptoms. He reports increasing seasonal allergy symptoms with age with season changes. He has ENT visit in near future.    He is a never smoker. He lives with his family, dog and cat. He works as a Technical sales engineer. No second hand smoke history.   OV 03/31/23 He reports his cough has resolved since starting Advair discus 250-50 mcg 1 puff twice daily.  He was treated for thrush with Diflucan that was noted by his dentist.  PFTs are within normal limits today.  Today 11/25/23 He uses his inhaler inconsistently, taking it in the morning when he goes out but often forgetting to take it before bedtime or on weekends if he stays home. He has gone periods without using it and does not notice any change in his symptoms, describing it as an 'out of sight, out of mind' situation.  He has discontinued all medications except for Benadryl, which he continues to take. He no  longer uses nasal sprays, including Atrovent.  No significant nasal drip or congestion beyond his usual allergy symptoms, which he attributes to pollen season and frequent transitions between indoor and outdoor environments. No issues with reflux.  Past Medical History:  Diagnosis Date   GERD (gastroesophageal reflux disease)    PONV (postoperative nausea and vomiting)      Family History  Problem Relation Age of Onset   Migraines Mother    Fibromyalgia Mother    ALS Father      Social History   Socioeconomic History   Marital status: Married    Spouse name: Not on file   Number of children: 2   Years of education: Not on file   Highest education level: Not on file  Occupational History   Not on file  Tobacco Use   Smoking status: Never   Smokeless tobacco: Former    Types: Snuff   Tobacco comments:    smokeless pouches-2 to 3 pouches daily, since around 1980s off and on  Vaping Use   Vaping status: Never Used  Substance and Sexual Activity   Alcohol use: Yes    Comment: occasionally beer-2 or 3  per weekend   Drug use: No   Sexual activity: Yes  Other Topics Concern   Not on file  Social History Narrative   Not on file   Social Drivers of Health   Financial Resource Strain: Not on file  Food Insecurity: Not on file  Transportation Needs: Not on file  Physical Activity: Not on file  Stress: Not on file  Social Connections: Not on file  Intimate Partner Violence: Not on file     No Known Allergies   Outpatient Medications Prior to Visit  Medication Sig Dispense Refill   fluticasone-salmeterol (ADVAIR) 100-50 MCG/ACT AEPB Inhale 1 puff into the lungs 2 (two) times daily. 60 each 1   fluticasone (FLONASE) 50 MCG/ACT nasal spray Place 2 sprays into both nostrils daily. (Patient not taking: Reported on 11/25/2023)     fluticasone-salmeterol (ADVAIR) 100-50 MCG/ACT AEPB Inhale 1 puff into the lungs 2 (two) times daily. (Patient not taking: Reported on 11/25/2023)  60 each 2   fluticasone-salmeterol (ADVAIR) 250-50 MCG/ACT AEPB Inhale 1 puff into the lungs in the morning and at bedtime. (Patient not taking: Reported on 11/25/2023) 60 each 0   ipratropium (ATROVENT) 0.03 % nasal spray Place 2 sprays into both nostrils every 12 (twelve) hours. (Patient not taking: Reported on 11/25/2023) 30 mL 12   No facility-administered medications prior to visit.   Review of Systems  Constitutional:  Negative for chills, fever, malaise/fatigue and weight loss.  HENT:  Negative for congestion, sinus pain and sore throat.   Eyes: Negative.   Respiratory:  Negative for cough, hemoptysis, sputum production, shortness of breath and wheezing.   Cardiovascular:  Negative for chest pain, palpitations, orthopnea, claudication and leg swelling.  Gastrointestinal:  Negative for abdominal pain, heartburn, nausea and vomiting.  Genitourinary: Negative.   Musculoskeletal:  Negative for joint pain and myalgias.  Skin:  Negative for rash.  Neurological:  Negative for weakness.  Psychiatric/Behavioral: Negative.     Objective:   Vitals:   11/25/23 1315  BP: (!) 143/84  Pulse: 84  SpO2: 98%  Weight: 140 lb 9.6 oz (63.8 kg)  Height: 5' 5.5" (1.664 m)    Physical Exam Constitutional:      General: He is not in acute distress.    Appearance: Normal appearance.  HENT:     Head: Normocephalic and atraumatic.  Eyes:     General: No scleral icterus.    Conjunctiva/sclera: Conjunctivae normal.  Cardiovascular:     Rate and Rhythm: Normal rate and regular rhythm.     Pulses: Normal pulses.     Heart sounds: Normal heart sounds. No murmur heard. Pulmonary:     Effort: Pulmonary effort is normal.     Breath sounds: Normal breath sounds. No wheezing, rhonchi or rales.  Musculoskeletal:     Right lower leg: No edema.     Left lower leg: No edema.  Skin:    General: Skin is warm and dry.  Neurological:     General: No focal deficit present.     Mental Status: He is alert.     CBC    Component Value Date/Time   WBC 6.5 01/13/2023 1014   RBC 4.97 01/13/2023 1014   HGB 15.0 01/13/2023 1014   HGB 14.9 09/18/2016 0839   HCT 42.9 01/13/2023 1014   HCT 43.5 09/18/2016 0839   PLT 165.0 01/13/2023 1014   PLT 207 09/18/2016 0839   MCV 86.3 01/13/2023 1014   MCV 93 09/18/2016 0839   MCH 31.6 08/28/2018 1313   MCHC 35.0 01/13/2023 1014   RDW 15.1 01/13/2023 1014   RDW 14.0 09/18/2016 0839   LYMPHSABS 0.7 01/13/2023 1014   LYMPHSABS 1.1 09/18/2016 0839   MONOABS 0.4 01/13/2023 1014   EOSABS 0.5 01/13/2023 1014   EOSABS 0.6 (H) 09/18/2016 1610  BASOSABS 0.0 01/13/2023 1014   BASOSABS 0.0 09/18/2016 0839      Latest Ref Rng & Units 08/28/2018    1:13 PM 09/18/2016    8:39 AM  BMP  Glucose 70 - 99 mg/dL 95  87   BUN 6 - 20 mg/dL 16  16   Creatinine 4.09 - 1.24 mg/dL 8.11  9.14   BUN/Creat Ratio 9 - 20  15   Sodium 135 - 145 mmol/L 137  141   Potassium 3.5 - 5.1 mmol/L 4.0  4.7   Chloride 98 - 111 mmol/L 104  100   CO2 22 - 32 mmol/L 25  25   Calcium 8.9 - 10.3 mg/dL 9.0  9.4    Chest imaging: CXR 10/02/22 - [facility]  The lungs are clear and negative for focal airspace consolidation, pulmonary edema or suspicious pulmonary nodule. No pleural effusion or pneumothorax. Cardiac and mediastinal contours are within normal limits. No acute fracture or lytic or blastic osseous lesions. The visualized upper abdominal bowel gas pattern is unremarkable.   PFT:    Latest Ref Rng & Units 03/19/2023    9:00 AM  PFT Results  FVC-Pre L 4.00   FVC-Predicted Pre % 98   FVC-Post L 3.86   FVC-Predicted Post % 95   Pre FEV1/FVC % % 82   Post FEV1/FCV % % 86   FEV1-Pre L 3.26   FEV1-Predicted Pre % 105   FEV1-Post L 3.30   DLCO uncorrected ml/min/mmHg 24.52   DLCO UNC% % 102   DLCO corrected ml/min/mmHg 24.25   DLCO COR %Predicted % 100   DLVA Predicted % 98   TLC L 6.08   TLC % Predicted % 101   RV % Predicted % 97     Labs:  Path:  Echo:  Heart  Catheterization:  Assessment & Plan:   Mild intermittent asthma without complication  Discussion: James Ferguson is a 58 year old male, former smoker with history of GERD who returns to pulmonary clinic for cough.   The etiology of his chronic cough includes sinus congestion with post-nasal drainage vs reactive airways disease vs seasonal allergies vs silent GERD.   Mild Intermittent Asthma Inconsistent Advair use with no symptom change. High allergic inflammation markers in the past but asymptomatic currently.  - Use Advair inhaler as needed. - If symptoms increase, transition back to scheduled inhaler use.  Allergic rhinitis Intermittent nasal drip and congestion during pollen season. High allergic inflammation markers but asymptomatic. Reassuring. - Use Flonase nasal spray as needed, especially during allergy season.  Follow up as needed  [name], MD [facility] Office: [phone]   Current Outpatient Medications:    fluticasone-salmeterol (ADVAIR) 100-50 MCG/ACT AEPB, Inhale 1 puff into the lungs 2 (two) times daily., Disp: 60 each, Rfl: 1

## 2023-11-25 NOTE — Patient Instructions (Addendum)
 Use advair inhaler 1 puff twice daily as needed - rinse mouth out  after each use  Use flonase nasal spray as needed.   Follow up as needed

## 2023-11-26 ENCOUNTER — Encounter: Payer: Self-pay | Admitting: Pulmonary Disease

## 2024-02-02 DIAGNOSIS — K315 Obstruction of duodenum: Secondary | ICD-10-CM | POA: Insufficient documentation

## 2024-02-03 DIAGNOSIS — K311 Adult hypertrophic pyloric stenosis: Secondary | ICD-10-CM | POA: Insufficient documentation

## 2024-02-13 DIAGNOSIS — R808 Other proteinuria: Secondary | ICD-10-CM | POA: Insufficient documentation

## 2024-04-14 DIAGNOSIS — N022 Recurrent and persistent hematuria with diffuse membranous glomerulonephritis: Secondary | ICD-10-CM | POA: Insufficient documentation

## 2024-04-14 DIAGNOSIS — D8984 IgG4-related disease: Secondary | ICD-10-CM | POA: Insufficient documentation

## 2024-04-14 DIAGNOSIS — R030 Elevated blood-pressure reading, without diagnosis of hypertension: Secondary | ICD-10-CM | POA: Insufficient documentation

## 2024-06-02 ENCOUNTER — Ambulatory Visit: Payer: Managed Care, Other (non HMO) | Admitting: Dermatology

## 2024-06-02 ENCOUNTER — Encounter: Payer: Self-pay | Admitting: Dermatology

## 2024-06-02 ENCOUNTER — Ambulatory Visit: Admitting: Dermatology

## 2024-06-02 DIAGNOSIS — L810 Postinflammatory hyperpigmentation: Secondary | ICD-10-CM

## 2024-06-02 DIAGNOSIS — L814 Other melanin hyperpigmentation: Secondary | ICD-10-CM

## 2024-06-02 DIAGNOSIS — D1801 Hemangioma of skin and subcutaneous tissue: Secondary | ICD-10-CM

## 2024-06-02 DIAGNOSIS — D2372 Other benign neoplasm of skin of left lower limb, including hip: Secondary | ICD-10-CM

## 2024-06-02 DIAGNOSIS — L578 Other skin changes due to chronic exposure to nonionizing radiation: Secondary | ICD-10-CM

## 2024-06-02 DIAGNOSIS — C44212 Basal cell carcinoma of skin of right ear and external auricular canal: Secondary | ICD-10-CM

## 2024-06-02 DIAGNOSIS — L821 Other seborrheic keratosis: Secondary | ICD-10-CM

## 2024-06-02 DIAGNOSIS — D492 Neoplasm of unspecified behavior of bone, soft tissue, and skin: Secondary | ICD-10-CM | POA: Diagnosis not present

## 2024-06-02 DIAGNOSIS — W908XXA Exposure to other nonionizing radiation, initial encounter: Secondary | ICD-10-CM

## 2024-06-02 DIAGNOSIS — D229 Melanocytic nevi, unspecified: Secondary | ICD-10-CM

## 2024-06-02 DIAGNOSIS — Z1283 Encounter for screening for malignant neoplasm of skin: Secondary | ICD-10-CM | POA: Diagnosis not present

## 2024-06-02 DIAGNOSIS — B078 Other viral warts: Secondary | ICD-10-CM | POA: Diagnosis not present

## 2024-06-02 DIAGNOSIS — C4491 Basal cell carcinoma of skin, unspecified: Secondary | ICD-10-CM

## 2024-06-02 DIAGNOSIS — D239 Other benign neoplasm of skin, unspecified: Secondary | ICD-10-CM

## 2024-06-02 HISTORY — DX: Basal cell carcinoma of skin, unspecified: C44.91

## 2024-06-02 NOTE — Patient Instructions (Addendum)

## 2024-06-02 NOTE — Progress Notes (Signed)
 Follow-Up Visit   Subjective  James Ferguson is a 58 y.o. male who presents for the following: Skin Cancer Screening and Full Body Skin Exam. No personal Hx of skin cancer or dysplastic nevi. Hx of AKs.   The patient presents for Total-Body Skin Exam (TBSE) for skin cancer screening and mole check. The patient has spots, moles and lesions to be evaluated, some may be new or changing and the patient may have concern these could be cancer.    The following portions of the chart were reviewed this encounter and updated as appropriate: medications, allergies, medical history  Review of Systems:  No other skin or systemic complaints except as noted in HPI or Assessment and Plan.  Objective  Well appearing patient in no apparent distress; mood and affect are within normal limits.  A full examination was performed including scalp, head, eyes, ears, nose, lips, neck, chest, axillae, abdomen, back, buttocks, bilateral upper extremities, bilateral lower extremities, hands, feet, fingers, toes, fingernails, and toenails. All findings within normal limits unless otherwise noted below.   Relevant physical exam findings are noted in the Assessment and Plan.  Right Antihelix Pink scaly patch   Right Dorsal Forearm Pink scaly papules coalescing to plaques R dorsal hand, R forearm, anterior lower legs b/l   Assessment & Plan   SKIN CANCER SCREENING PERFORMED TODAY.  ACTINIC DAMAGE - Chronic condition, secondary to cumulative UV/sun exposure - diffuse scaly erythematous macules with underlying dyspigmentation - Recommend daily broad spectrum sunscreen SPF 30+ to sun-exposed areas, reapply every 2 hours as needed.  - Staying in the shade or wearing long sleeves, sun glasses (UVA+UVB protection) and wide brim hats (4-inch brim around the entire circumference of the hat) are also recommended for sun protection.  - Call for new or changing lesions.  LENTIGINES, SEBORRHEIC KERATOSES, HEMANGIOMAS -  Benign normal skin lesions - Benign-appearing - Call for any changes  MELANOCYTIC NEVI - Tan-brown and/or pink-flesh-colored symmetric macules and papules - Benign appearing on exam today - Observation - Call clinic for new or changing moles - Recommend daily use of broad spectrum spf 30+ sunscreen to sun-exposed areas.    DERMATOFIBROMA Exam: Firm pink/brown papulenodule with dimple sign at sup to left lateral malleolus, left posterior lateral thigh   Treatment Plan: A dermatofibroma is a benign growth possibly related to trauma, such as an insect bite, cut from shaving, or inflamed acne-type bump.  Treatment options to remove include shave or excision with resulting scar and risk of recurrence.  Since benign-appearing and not bothersome, will observe for now.   POST-INFLAMMATORY HYPOPIGMENTATION (PIH) Exam: hypopigmented patches at shins   Post-inflammatory hyperpimentation (PIH)  is a benign condition that comes from having previous inflammation in the skin and will fade with time over months to sometimes years. Recommend daily sun protection including sunscreen SPF 30+ to sun-exposed areas. - Recommend treating any itchy or red areas on the skin quickly to prevent new areas of PIH. Once rash has cleared, treating with prescription medicines such as hydroquinone may help fade dark spots faster.    Treatment Plan:  none   NEOPLASM OF SKIN (2) Right Antihelix Skin / nail biopsy Type of biopsy: tangential   Informed consent: discussed and consent obtained   Timeout: patient name, date of birth, surgical site, and procedure verified   Procedure prep:  Patient was prepped and draped in usual sterile fashion Prep type:  Isopropyl alcohol Anesthesia: the lesion was anesthetized in a standard fashion  Anesthetic:  1% lidocaine  w/ epinephrine  1-100,000 buffered w/ 8.4% NaHCO3 Instrument used: DermaBlade   Hemostasis achieved with: pressure and aluminum chloride   Outcome: patient  tolerated procedure well   Post-procedure details: sterile dressing applied and wound care instructions given   Dressing type: bandage and petrolatum    Specimen 1 - Surgical pathology Differential Diagnosis: BCC vs AK  Check Margins: No Right Dorsal Forearm Skin / nail biopsy Type of biopsy: tangential   Informed consent: discussed and consent obtained   Timeout: patient name, date of birth, surgical site, and procedure verified   Procedure prep:  Patient was prepped and draped in usual sterile fashion Prep type:  Isopropyl alcohol Anesthesia: the lesion was anesthetized in a standard fashion   Anesthetic:  1% lidocaine  w/ epinephrine  1-100,000 buffered w/ 8.4% NaHCO3 Instrument used: DermaBlade   Hemostasis achieved with: pressure and aluminum chloride   Outcome: patient tolerated procedure well   Post-procedure details: sterile dressing applied and wound care instructions given   Dressing type: bandage and petrolatum    Specimen 2 - Surgical pathology Differential Diagnosis: Verruca vs SCC vs psoriasis   Check Margins: No MULTIPLE BENIGN NEVI   LENTIGINES   ACTINIC ELASTOSIS   SEBORRHEIC KERATOSES   CHERRY ANGIOMA   DERMATOFIBROMA   Return in about 1 year (around 06/02/2025) for TBSE, HxAK.  I, Jill Parcell, CMA, am acting as scribe for Boneta Sharps, MD.   Documentation: I have reviewed the above documentation for accuracy and completeness, and I agree with the above.  Boneta Sharps, MD

## 2024-06-07 ENCOUNTER — Ambulatory Visit: Payer: Self-pay | Admitting: Dermatology

## 2024-06-07 DIAGNOSIS — C44212 Basal cell carcinoma of skin of right ear and external auricular canal: Secondary | ICD-10-CM

## 2024-06-07 LAB — SURGICAL PATHOLOGY

## 2024-06-08 ENCOUNTER — Encounter: Payer: Self-pay | Admitting: Dermatology

## 2024-06-08 NOTE — Telephone Encounter (Signed)
 Reviewed pathology results with patient, he prefers [facility] for Mohs. Referral sent to [facility]. Lonell RAMAN., RMA

## 2024-06-08 NOTE — Telephone Encounter (Signed)
-----   Message from [facility] sent at 06/07/2024  6:03 PM EDT ----- Diagnosis: 1. Skin, right antihelix :       BASAL CELL CARCINOMA WITH FOCAL SCLEROSIS, SEE DESCRIPTION        2. Skin, right dorsal forearm :       VERRUCA PLANA   Please call with diagnosis and determine where the patient would like to have Mohs surgery. Recommend skin check in 6 months  R EAR Explanation: your biopsy shows a basal cell skin cancer in the second layer of the skin. This is the most common kind of skin cancer and is caused by damage from sun exposure. Basal cell skin cancers  almost never spread beyond the skin, so they are not dangerous to your overall health. However, they will continue to grow, can bleed, cause nonhealing wounds, and disrupt nearby structures unless  fully treated.  Treatment: Given the location and type of skin cancer, I recommend Mohs surgery. Mohs surgery involves cutting out the skin cancer and then checking under the microscope to ensure the whole skin  cancer was removed. If any skin cancer remains, the surgeon will cut out more until it is fully removed. The cure rate is about 98-99%. Once the Mohs surgeon confirms the skin cancer is out, they  will discuss the options to repair or heal the area. You must take it easy for about two weeks after surgery (no lifting over 10-15 lbs, avoid activity to get your heart rate and blood pressure up).  It is done at another office outside of [place] ([place], [place], or [place]).   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 They can be difficult to clear. Since there are so many, I recommend a cream called Efudex to help your immune system clear the viruses.  Apply it every night until the warts resolve. At most, treat an area up to the size of your palm at any given time. The cream can cause redness and irritation. If one area becomes irritated to the  point of being uncomfortable, take a week off  treating that area before restarting. ----- Message ----- From: Interface, [facility] Sent: 06/07/2024   5:44 PM EDT To: Boneta Sharps, MD

## 2024-12-12 ENCOUNTER — Ambulatory Visit: Admitting: Dermatology

## 2025-06-05 ENCOUNTER — Ambulatory Visit: Admitting: Dermatology
